# Patient Record
Sex: Female | Born: 1965
Health system: Southern US, Community
[De-identification: ages and names within clinical notes are randomized; demographics above are authoritative.]

## PROBLEM LIST (undated history)

## (undated) DIAGNOSIS — J302 Other seasonal allergic rhinitis: Secondary | ICD-10-CM

## (undated) DIAGNOSIS — F419 Anxiety disorder, unspecified: Secondary | ICD-10-CM

## (undated) DIAGNOSIS — Z87442 Personal history of urinary calculi: Secondary | ICD-10-CM

## (undated) DIAGNOSIS — F32A Depression, unspecified: Secondary | ICD-10-CM

## (undated) DIAGNOSIS — F329 Major depressive disorder, single episode, unspecified: Secondary | ICD-10-CM

## (undated) DIAGNOSIS — Z973 Presence of spectacles and contact lenses: Secondary | ICD-10-CM

## (undated) DIAGNOSIS — J3089 Other allergic rhinitis: Secondary | ICD-10-CM

## (undated) DIAGNOSIS — Z9889 Other specified postprocedural states: Secondary | ICD-10-CM

## (undated) DIAGNOSIS — R112 Nausea with vomiting, unspecified: Secondary | ICD-10-CM

## (undated) DIAGNOSIS — K6289 Other specified diseases of anus and rectum: Secondary | ICD-10-CM

## (undated) DIAGNOSIS — M1990 Unspecified osteoarthritis, unspecified site: Secondary | ICD-10-CM

## (undated) DIAGNOSIS — K601 Chronic anal fissure: Secondary | ICD-10-CM

## (undated) HISTORY — PX: LAMINECTOMY AND MICRODISCECTOMY LUMBAR SPINE: SHX1913

## (undated) HISTORY — PX: APPENDECTOMY: SHX54

---

## 1991-12-09 HISTORY — PX: VAGINAL HYSTERECTOMY: SUR661

## 1998-11-26 ENCOUNTER — Encounter: Payer: Self-pay | Admitting: Family Medicine

## 1998-11-26 ENCOUNTER — Ambulatory Visit (HOSPITAL_COMMUNITY): Admission: RE | Admit: 1998-11-26 | Discharge: 1998-11-26 | Payer: Self-pay | Admitting: Family Medicine

## 2001-03-04 ENCOUNTER — Ambulatory Visit (HOSPITAL_COMMUNITY): Admission: RE | Admit: 2001-03-04 | Discharge: 2001-03-04 | Payer: Self-pay | Admitting: Family Medicine

## 2001-03-04 ENCOUNTER — Encounter: Payer: Self-pay | Admitting: Family Medicine

## 2004-07-03 ENCOUNTER — Other Ambulatory Visit: Admission: RE | Admit: 2004-07-03 | Discharge: 2004-07-03 | Payer: Self-pay | Admitting: Family Medicine

## 2005-10-09 ENCOUNTER — Ambulatory Visit (HOSPITAL_COMMUNITY): Admission: RE | Admit: 2005-10-09 | Discharge: 2005-10-09 | Payer: Self-pay | Admitting: Neurological Surgery

## 2006-09-16 ENCOUNTER — Other Ambulatory Visit: Admission: RE | Admit: 2006-09-16 | Discharge: 2006-09-16 | Payer: Self-pay | Admitting: Family Medicine

## 2007-10-21 ENCOUNTER — Other Ambulatory Visit: Admission: RE | Admit: 2007-10-21 | Discharge: 2007-10-21 | Payer: Self-pay | Admitting: Family Medicine

## 2011-04-25 NOTE — Op Note (Signed)
NAMETIWANA, CHAVIS               ACCOUNT NO.:  [id number]   MEDICAL RECORD NO.:  [id number]          PATIENT TYPE:  AMB   LOCATION:  SDS                          FACILITY:  [facility]   PHYSICIAN:  Tia Alert, MD     DATE OF BIRTH:  11-30-1966   DATE OF PROCEDURE:  10/09/2005  DATE OF DISCHARGE:                                 OPERATIVE REPORT   PREOPERATIVE DIAGNOSIS:  Lumbar disc herniation L5-S1 on the right with  right S1 radiculopathy.   POSTOPERATIVE DIAGNOSIS:  Lumbar disc herniation L5-S1 on the right with right S1 radiculopathy.   PROCEDURE:  Right hemilaminectomy, medial facetectomy, and foraminotomy L5-  S1 on the right followed by microdiscectomy L5-S1 on the right utilizing  microscopic dissection.   SURGEON:  Tia Alert, MD   ASSISTANT:  Reinaldo Meeker, M.D.   ANESTHESIA:  General endotracheal anesthesia.   COMPLICATIONS:  None apparent.   INDICATIONS FOR PROCEDURE:  Ms. Munoz is a 45 year old white female who was  referred with right leg pain.  She had an MRI which showed a very large disc  herniation at L5-S1 on the right side with severe compression of the right  S1 nerve root.  She had lost her right Achilles reflex and, also, had  weakness in her right calf.  I recommended a lumbar discectomy at L5-S1 on  the right.  She understood the risks, benefits, and expected outcomes, and  wished to proceed.   DESCRIPTION OF PROCEDURE:  The patient was taken to the operating room and  after induction of adequate general endotracheal anesthesia, she was rolled  in a prone position on the Wilson frame and all pressure points padded.  Her  lumbar region was prepped with DuraPrep and then draped in the usual sterile  fashion.  3 mL of local anesthesia was injected and a small dorsal midline  incision was made and carried down to the lumbosacral  fascia.  The fascia  was opened on the patient's right side and taken down in a subperiosteal  fashion to expose the  L5-S1 interspace on the right side.  Interoperative x-  ray confirmed my level and then hemilaminectomy, medial facetectomy, and  foraminotomy was performed at L5-S1 on the right side utilizing the 3 mm  Kerrison punch.  The yellow ligament was opened and removed with the  Kerrison punch.  The underlying dura and S1 nerve root was identified and  decompressed.  I then retracted this medially and coagulated the epidural  venous vasculature and found a large free fragment.  I removed this with a  combination of a nerve hook and pituitary rongeur.  I then performed a  thorough intradiskal discectomy and then palpated with a nerve hook and a  coronary dilator to assure adequate decompression and assured there were no  more free fragments underneath the nerve root.  I dissected into the  foramen.  The nerve root was free.  There was no evidence of residual disc.  I then copiously irrigated with Bacitracin containing saline solution, dried  all bleeding points with bipolar cautery, lined  the dura with Gelfoam, and  then closed the fascia with interrupted #1 Vicryl, closed the subcutaneous  and subcuticular tissues with 2-0 and  3-0 Vicryl, and closed the skin with Dermabond.  The drapes were removed and  sterile dressing was applied.  The patient was awakened from general  anesthesia and transported to the recovery room in stable condition.  At the  end of the procedure, all sponge, needle, and instrument counts were  correct.      Tia Alert, MD  Electronically Signed     DSJ/MEDQ  D:  10/09/2005  T:  10/09/2005  Job:  [id number]

## 2014-05-22 ENCOUNTER — Other Ambulatory Visit: Payer: Self-pay

## 2014-05-22 DIAGNOSIS — Z1231 Encounter for screening mammogram for malignant neoplasm of breast: Secondary | ICD-10-CM

## 2014-05-29 ENCOUNTER — Encounter (INDEPENDENT_AMBULATORY_CARE_PROVIDER_SITE_OTHER): Payer: Self-pay

## 2014-05-29 ENCOUNTER — Ambulatory Visit
Admission: RE | Admit: 2014-05-29 | Discharge: 2014-05-29 | Disposition: A | Discharge status: Home / Self Care | Payer: Self-pay | Source: Ambulatory Visit

## 2014-05-29 DIAGNOSIS — Z1231 Encounter for screening mammogram for malignant neoplasm of breast: Secondary | ICD-10-CM

## 2015-06-26 ENCOUNTER — Other Ambulatory Visit: Payer: Self-pay | Admitting: Obstetrics and Gynecology

## 2015-06-28 ENCOUNTER — Other Ambulatory Visit: Payer: Self-pay | Admitting: Obstetrics and Gynecology

## 2015-06-28 DIAGNOSIS — M810 Age-related osteoporosis without current pathological fracture: Secondary | ICD-10-CM

## 2015-06-28 DIAGNOSIS — Z1231 Encounter for screening mammogram for malignant neoplasm of breast: Secondary | ICD-10-CM

## 2015-07-06 ENCOUNTER — Ambulatory Visit: Payer: Self-pay

## 2015-07-06 ENCOUNTER — Other Ambulatory Visit: Payer: Self-pay

## 2015-07-26 ENCOUNTER — Ambulatory Visit
Admission: RE | Admit: 2015-07-26 | Discharge: 2015-07-26 | Disposition: A | Discharge status: Home / Self Care | Payer: BLUE CROSS/BLUE SHIELD | Source: Ambulatory Visit | Attending: Obstetrics and Gynecology | Admitting: Obstetrics and Gynecology

## 2015-07-26 DIAGNOSIS — M810 Age-related osteoporosis without current pathological fracture: Secondary | ICD-10-CM

## 2015-07-26 DIAGNOSIS — Z1231 Encounter for screening mammogram for malignant neoplasm of breast: Secondary | ICD-10-CM

## 2015-09-14 ENCOUNTER — Ambulatory Visit: Payer: Self-pay

## 2015-09-14 ENCOUNTER — Other Ambulatory Visit: Payer: Self-pay

## 2016-10-25 DIAGNOSIS — Z23 Encounter for immunization: Secondary | ICD-10-CM | POA: Diagnosis not present

## 2016-12-02 DIAGNOSIS — J329 Chronic sinusitis, unspecified: Secondary | ICD-10-CM | POA: Diagnosis not present

## 2017-02-04 ENCOUNTER — Other Ambulatory Visit: Payer: Self-pay | Admitting: Physician Assistant

## 2017-02-04 ENCOUNTER — Other Ambulatory Visit (HOSPITAL_COMMUNITY)
Admission: RE | Admit: 2017-02-04 | Discharge: 2017-02-04 | Disposition: A | Discharge status: Home / Self Care | Payer: BLUE CROSS/BLUE SHIELD | Source: Ambulatory Visit | Attending: Physician Assistant | Admitting: Physician Assistant

## 2017-02-04 DIAGNOSIS — Z1151 Encounter for screening for human papillomavirus (HPV): Secondary | ICD-10-CM | POA: Diagnosis not present

## 2017-02-04 DIAGNOSIS — K580 Irritable bowel syndrome with diarrhea: Secondary | ICD-10-CM | POA: Diagnosis not present

## 2017-02-04 DIAGNOSIS — G4700 Insomnia, unspecified: Secondary | ICD-10-CM | POA: Diagnosis not present

## 2017-02-04 DIAGNOSIS — F419 Anxiety disorder, unspecified: Secondary | ICD-10-CM | POA: Diagnosis not present

## 2017-02-04 DIAGNOSIS — Z0000 Encounter for general adult medical examination without abnormal findings: Secondary | ICD-10-CM | POA: Insufficient documentation

## 2017-02-04 DIAGNOSIS — J309 Allergic rhinitis, unspecified: Secondary | ICD-10-CM | POA: Diagnosis not present

## 2017-02-05 LAB — CYTOLOGY - PAP
Diagnosis: NEGATIVE
HPV: NOT DETECTED

## 2017-04-02 DIAGNOSIS — K580 Irritable bowel syndrome with diarrhea: Secondary | ICD-10-CM | POA: Diagnosis not present

## 2017-04-02 DIAGNOSIS — Z1211 Encounter for screening for malignant neoplasm of colon: Secondary | ICD-10-CM | POA: Diagnosis not present

## 2017-05-16 DIAGNOSIS — S40862A Insect bite (nonvenomous) of left upper arm, initial encounter: Secondary | ICD-10-CM | POA: Diagnosis not present

## 2017-09-08 DIAGNOSIS — M62830 Muscle spasm of back: Secondary | ICD-10-CM | POA: Diagnosis not present

## 2017-09-08 DIAGNOSIS — F419 Anxiety disorder, unspecified: Secondary | ICD-10-CM | POA: Diagnosis not present

## 2017-09-08 DIAGNOSIS — Z23 Encounter for immunization: Secondary | ICD-10-CM | POA: Diagnosis not present

## 2018-02-11 DIAGNOSIS — K580 Irritable bowel syndrome with diarrhea: Secondary | ICD-10-CM | POA: Diagnosis not present

## 2018-02-11 DIAGNOSIS — G4700 Insomnia, unspecified: Secondary | ICD-10-CM | POA: Diagnosis not present

## 2018-02-11 DIAGNOSIS — J309 Allergic rhinitis, unspecified: Secondary | ICD-10-CM | POA: Diagnosis not present

## 2018-02-11 DIAGNOSIS — M545 Low back pain: Secondary | ICD-10-CM | POA: Diagnosis not present

## 2018-03-18 DIAGNOSIS — K6289 Other specified diseases of anus and rectum: Secondary | ICD-10-CM | POA: Diagnosis not present

## 2018-03-18 DIAGNOSIS — R194 Change in bowel habit: Secondary | ICD-10-CM | POA: Diagnosis not present

## 2018-03-18 DIAGNOSIS — K600 Acute anal fissure: Secondary | ICD-10-CM | POA: Diagnosis not present

## 2018-03-22 DIAGNOSIS — K580 Irritable bowel syndrome with diarrhea: Secondary | ICD-10-CM | POA: Diagnosis not present

## 2018-03-22 DIAGNOSIS — K600 Acute anal fissure: Secondary | ICD-10-CM | POA: Diagnosis not present

## 2018-03-22 DIAGNOSIS — K6289 Other specified diseases of anus and rectum: Secondary | ICD-10-CM | POA: Diagnosis not present

## 2018-03-22 DIAGNOSIS — Z1211 Encounter for screening for malignant neoplasm of colon: Secondary | ICD-10-CM | POA: Diagnosis not present

## 2018-03-31 ENCOUNTER — Emergency Department (HOSPITAL_COMMUNITY)
Admission: EM | Admit: 2018-03-31 | Discharge: 2018-03-31 | Disposition: A | Discharge status: Home / Self Care | Payer: BLUE CROSS/BLUE SHIELD | Attending: Emergency Medicine | Admitting: Emergency Medicine

## 2018-03-31 ENCOUNTER — Encounter (HOSPITAL_COMMUNITY): Payer: Self-pay

## 2018-03-31 ENCOUNTER — Other Ambulatory Visit: Payer: Self-pay

## 2018-03-31 DIAGNOSIS — K648 Other hemorrhoids: Secondary | ICD-10-CM | POA: Diagnosis not present

## 2018-03-31 DIAGNOSIS — K644 Residual hemorrhoidal skin tags: Secondary | ICD-10-CM

## 2018-03-31 DIAGNOSIS — K6289 Other specified diseases of anus and rectum: Secondary | ICD-10-CM | POA: Diagnosis not present

## 2018-03-31 MED ORDER — OXYCODONE HCL 5 MG PO TABS: 5.0000 mg | ORAL_TABLET | Freq: Every day | ORAL | 0 refills | Status: DC

## 2018-03-31 MED ORDER — HYDROMORPHONE HCL 2 MG/ML IJ SOLN
1.0000 mg | Freq: Once | INTRAMUSCULAR | Status: AC
  Administered 2018-03-31: 1 mg via INTRAMUSCULAR
  Filled 2018-03-31: qty 1

## 2018-03-31 NOTE — Discharge Instructions (Signed)
You need further evaluation by general surgery.  If your symptoms are not improving with conservative management, you may need surgical intervention.   It is important to stay well hydrated, drink 1-2 L of water daily. Miralax at least once daily and stool softener to help stool remain soft and prevent further trauma.  Continue with lidocaine and diltiazem topical creams.    We discussed risk of consitipation with use of opioid pain medications.  To help give you some relief, I have given you a short prescription for oxycodone 5 mg. Take this only once daily with breakfast.  During the day take 1000 mg acetaminophen (tylenol) PLUS 600 mg ibuprofen (advil, aleve) every 6-8 hours.   Return for fevers, worsening pain, swelling to the area, abdominal pain, drainage of pus.

## 2018-03-31 NOTE — ED Triage Notes (Signed)
Pt presents for evaluation of ongoing rectal pain since early march. Pt reports initially thought it was hemorrhoids but then told it was an anal fissure. Has had multiple creams and pain meds with no relief. Is unable to tolerate sitting.

## 2018-03-31 NOTE — ED Notes (Signed)
Pt stable, ambulatory, states understanding of discharge instructions 

## 2018-03-31 NOTE — ED Notes (Signed)
Pt ambulated to bathroom 

## 2018-03-31 NOTE — ED Provider Notes (Signed)
[facility] EMERGENCY DEPARTMENT Provider Note   CSN: [id number] Arrival date & time: 03/31/18  4098     History   Chief Complaint Chief Complaint  Patient presents with  . Rectal Pain    HPI Cheyenne Ramirez is a 52 y.o. female with no past medical history is here for evaluation of rectal pain for 2 months.  Pain is severe, intermittent. Has been seen by primary care doctor, 2 gastroenterologists, urgent care since and has been told she has hemorrhoids and/or fissures.  PCP has referred her to [facility] surgery for further evaluation, awaiting appointment.  Has tried topical lidocaine, nitroglycerin, diltiazem, Aleve without relief.  States the pain is worse in the mornings after having a bowel movement.  Throughout the day pain is more tolerable however at nighttime pain returns.  She tries to sit, stand, walk, laying down while she is in pain and these did not help.  Associated symptoms include bright red blood on toilet paper and on stool.  She denies fevers, chills, abdominal pain.  Frequency and consistency of bowel movements unchanged over the last several months.  Does not take any pain medications.  HPI  History reviewed. No pertinent past medical history.  There are no active problems to display for this patient.   History reviewed. No pertinent surgical history.   OB History   None      Home Medications    Prior to Admission medications   Medication Sig Start Date End Date Taking? Authorizing Provider  oxyCODONE (OXY IR/ROXICODONE) 5 MG immediate release tablet Take 1 tablet (5 mg total) by mouth daily after breakfast for 5 doses. 03/31/18 04/05/18  Liberty Handy, PA-C    Family History No family history on file.  Social History Social History   Tobacco Use  . Smoking status: Not on file  Substance Use Topics  . Alcohol use: Not on file  . Drug use: Not on file     Allergies   Sulfa antibiotics   Review of  Systems Review of Systems  Genitourinary:       Rectal pain  All other systems reviewed and are negative.    Physical Exam Updated Vital Signs BP (!) 94/49   Pulse 66   Temp 98.6 F (37 C) (Oral)   Resp 14   SpO2 96%   Physical Exam  Constitutional: She is oriented to person, place, and time. She appears well-developed and well-nourished. No distress.  Non toxic  HENT:  Head: Normocephalic and atraumatic.  Nose: Nose normal.  Mouth/Throat: No oropharyngeal exudate.  Moist mucous membranes   Eyes: Pupils are equal, round, and reactive to light. Conjunctivae and EOM are normal.  Neck: Normal range of motion.  Cardiovascular: Normal rate, regular rhythm and intact distal pulses.  No murmur heard. 2+ DP and radial pulses bilaterally. No LE edema.   Pulmonary/Chest: Effort normal and breath sounds normal. No respiratory distress. She has no wheezes. She has no rales.  Abdominal: Soft. Bowel sounds are normal. There is no tenderness.  No G/R/R. No suprapubic or CVA tenderness.   Genitourinary:  Genitourinary Comments: 2 large very tender hemorrhoids posteriorly, non thrombosed, pink, without bleeding, erythema, drainage.  1 non tender hemorrhoid anteriorly, non thrombosed.  1 non tender internal hemorrhoid posteriorly, non tender.  RN present during exam. No external fissures noted.  No induration or swelling of the perianal skin.   Stool color is brown with no gross blood noted.   Able to  palpate 3-4 cm of rectal vault without signs of abscess.   DRE reveals good sphincter tone.    Musculoskeletal: Normal range of motion. She exhibits no deformity.  Neurological: She is alert and oriented to person, place, and time.  Skin: Skin is warm and dry. Capillary refill takes less than 2 seconds.  Psychiatric: She has a normal mood and affect. Her behavior is normal. Judgment and thought content normal.  Nursing note and vitals reviewed.    ED Treatments / Results  Labs (all  labs ordered are listed, but only abnormal results are displayed) Labs Reviewed - No data to display  EKG None  Radiology No results found.  Procedures Procedures (including critical care time)  Medications Ordered in ED Medications  HYDROmorphone (DILAUDID) injection 1 mg (1 mg Intramuscular Given 03/31/18 1433)     Initial Impression / Assessment and Plan / ED Course  I have reviewed the triage vital signs and the nursing notes.  Pertinent labs & imaging results that were available during my care of the patient were reviewed by me and considered in my medical decision making (see chart for details).     52 year old with rectal pain and hematochezia for 2 months.  Pain refractory to topical agents, NSAIDs.  She has had no fevers, chills, abdominal pain, changes in frequency and stool consistency.  Has been seen by multiple providers for this over the last 2 months and has referral for general surgery.  Exam as above shows nonthrombosed, exquisitely tender external hemorrhoids.  Afebrile.  Abdomen nontender nondistended.  Do not think emergent lab work or imaging is indicated at this time, low suspicion for deep pelvic/rectal abscess.  Will discharge with MiraLAX, stool softener, sitz baths, high-dose NSAIDs.  I discussed risk of opioid pain medication including constipation and worsening pain.  However patient has a 45-minute commute to and from work, states she cannot miss work days and is desperate for some pain relief.  Will give short rx for percocet to be used once a day in the AM, pt aware she needs to increase water intake and be compliant with miralax and stool softener. Discussed return precautions. She is to call general surgery today and schedule appointment. Final Clinical Impressions(s) / ED Diagnoses   Final diagnoses:  Rectal pain  External hemorrhoids    ED Discharge Orders        Ordered    oxyCODONE (OXY IR/ROXICODONE) 5 MG immediate release tablet  Daily after  breakfast     03/31/18 1436       Jerrell MylarGibbons, Claudia J, PA-C 03/31/18 1439    Benjiman CorePickering, Nathan, MD 03/31/18 2140

## 2018-04-05 ENCOUNTER — Other Ambulatory Visit: Payer: Self-pay

## 2018-04-05 ENCOUNTER — Encounter (HOSPITAL_BASED_OUTPATIENT_CLINIC_OR_DEPARTMENT_OTHER): Payer: Self-pay | Admitting: *Deleted

## 2018-04-05 DIAGNOSIS — K602 Anal fissure, unspecified: Secondary | ICD-10-CM | POA: Diagnosis not present

## 2018-04-05 NOTE — Progress Notes (Signed)
SPOKE W/ PT VIA PHONE FOR PRE-OP INTERVIEW.  NPO AFTER MN WITH EXCEPTION CLEAR LIQUIDS UNTIL 0815 (NO CREAM / MILK PRODUCTS).  ARRIVE AT 1215.  WILL TAKE CELEXA, XANAX, AND DO NASAL SPRAY AM DOS W/ SIPS OF WATER.

## 2018-04-06 ENCOUNTER — Ambulatory Visit: Payer: Self-pay | Admitting: General Surgery

## 2018-04-08 ENCOUNTER — Ambulatory Visit (HOSPITAL_BASED_OUTPATIENT_CLINIC_OR_DEPARTMENT_OTHER)
Admission: RE | Admit: 2018-04-08 | Discharge: 2018-04-08 | Disposition: A | Discharge status: Home / Self Care | Payer: BLUE CROSS/BLUE SHIELD | Source: Ambulatory Visit | Attending: General Surgery | Admitting: General Surgery

## 2018-04-08 ENCOUNTER — Encounter (HOSPITAL_BASED_OUTPATIENT_CLINIC_OR_DEPARTMENT_OTHER): Payer: Self-pay | Admitting: Anesthesiology

## 2018-04-08 ENCOUNTER — Encounter (HOSPITAL_BASED_OUTPATIENT_CLINIC_OR_DEPARTMENT_OTHER)
Admission: RE | Disposition: A | Discharge status: Home / Self Care | Payer: Self-pay | Source: Ambulatory Visit | Attending: General Surgery

## 2018-04-08 ENCOUNTER — Ambulatory Visit (HOSPITAL_BASED_OUTPATIENT_CLINIC_OR_DEPARTMENT_OTHER): Payer: BLUE CROSS/BLUE SHIELD | Admitting: Anesthesiology

## 2018-04-08 DIAGNOSIS — Z881 Allergy status to other antibiotic agents status: Secondary | ICD-10-CM | POA: Insufficient documentation

## 2018-04-08 DIAGNOSIS — K641 Second degree hemorrhoids: Secondary | ICD-10-CM | POA: Insufficient documentation

## 2018-04-08 DIAGNOSIS — K601 Chronic anal fissure: Secondary | ICD-10-CM | POA: Diagnosis not present

## 2018-04-08 DIAGNOSIS — Z882 Allergy status to sulfonamides status: Secondary | ICD-10-CM | POA: Insufficient documentation

## 2018-04-08 DIAGNOSIS — Z79899 Other long term (current) drug therapy: Secondary | ICD-10-CM | POA: Diagnosis not present

## 2018-04-08 DIAGNOSIS — M1990 Unspecified osteoarthritis, unspecified site: Secondary | ICD-10-CM | POA: Diagnosis not present

## 2018-04-08 DIAGNOSIS — K640 First degree hemorrhoids: Secondary | ICD-10-CM | POA: Diagnosis not present

## 2018-04-08 DIAGNOSIS — F329 Major depressive disorder, single episode, unspecified: Secondary | ICD-10-CM | POA: Diagnosis not present

## 2018-04-08 DIAGNOSIS — F419 Anxiety disorder, unspecified: Secondary | ICD-10-CM | POA: Insufficient documentation

## 2018-04-08 HISTORY — DX: Presence of spectacles and contact lenses: Z97.3

## 2018-04-08 HISTORY — DX: Anxiety disorder, unspecified: F41.9

## 2018-04-08 HISTORY — DX: Unspecified osteoarthritis, unspecified site: M19.90

## 2018-04-08 HISTORY — DX: Nausea with vomiting, unspecified: R11.2

## 2018-04-08 HISTORY — DX: Major depressive disorder, single episode, unspecified: F32.9

## 2018-04-08 HISTORY — PX: SPHINCTEROTOMY: SHX5279

## 2018-04-08 HISTORY — DX: Other specified diseases of anus and rectum: K62.89

## 2018-04-08 HISTORY — DX: Personal history of urinary calculi: Z87.442

## 2018-04-08 HISTORY — DX: Other allergic rhinitis: J30.89

## 2018-04-08 HISTORY — DX: Other seasonal allergic rhinitis: J30.2

## 2018-04-08 HISTORY — DX: Depression, unspecified: F32.A

## 2018-04-08 HISTORY — DX: Other specified postprocedural states: Z98.890

## 2018-04-08 HISTORY — DX: Chronic anal fissure: K60.1

## 2018-04-08 SURGERY — EXAM UNDER ANESTHESIA
Anesthesia: Monitor Anesthesia Care

## 2018-04-08 MED ORDER — SODIUM CHLORIDE 0.9 % IV SOLN
250.0000 mL | INTRAVENOUS | Status: DC | PRN
  Filled 2018-04-08: qty 250

## 2018-04-08 MED ORDER — LACTATED RINGERS IV SOLN
INTRAVENOUS | Status: DC
  Filled 2018-04-08: qty 1000

## 2018-04-08 MED ORDER — ONDANSETRON HCL 4 MG/2ML IJ SOLN
INTRAMUSCULAR | Status: DC | PRN
  Administered 2018-04-08: 4 mg via INTRAVENOUS

## 2018-04-08 MED ORDER — BUPIVACAINE LIPOSOME 1.3 % IJ SUSP
INTRAMUSCULAR | Status: DC | PRN
  Administered 2018-04-08: 20 mL

## 2018-04-08 MED ORDER — ONDANSETRON HCL 4 MG/2ML IJ SOLN
INTRAMUSCULAR | Status: AC
  Filled 2018-04-08: qty 2

## 2018-04-08 MED ORDER — ACETAMINOPHEN 325 MG PO TABS
650.0000 mg | ORAL_TABLET | ORAL | Status: DC | PRN
Start: 2018-04-08 — End: 2018-04-08
  Filled 2018-04-08: qty 2

## 2018-04-08 MED ORDER — PROPOFOL 500 MG/50ML IV EMUL
INTRAVENOUS | Status: DC | PRN
  Administered 2018-04-08: 200 ug/kg/min via INTRAVENOUS

## 2018-04-08 MED ORDER — FENTANYL CITRATE (PF) 100 MCG/2ML IJ SOLN
25.0000 ug | INTRAMUSCULAR | Status: DC | PRN
  Filled 2018-04-08: qty 1

## 2018-04-08 MED ORDER — ACETAMINOPHEN 500 MG PO TABS
ORAL_TABLET | ORAL | Status: AC
  Filled 2018-04-08: qty 2

## 2018-04-08 MED ORDER — PROPOFOL 10 MG/ML IV BOLUS
INTRAVENOUS | Status: AC
Start: 2018-04-08 — End: 2018-04-08
  Filled 2018-04-08: qty 20

## 2018-04-08 MED ORDER — ONABOTULINUMTOXINA 100 UNITS IJ SOLR
INTRAMUSCULAR | Status: DC | PRN
  Administered 2018-04-08: 100 [IU] via INTRAMUSCULAR

## 2018-04-08 MED ORDER — LIDOCAINE 5 % EX OINT
TOPICAL_OINTMENT | CUTANEOUS | Status: DC | PRN
  Administered 2018-04-08: 1

## 2018-04-08 MED ORDER — SODIUM CHLORIDE 0.9% FLUSH
3.0000 mL | Freq: Two times a day (BID) | INTRAVENOUS | Status: DC
  Filled 2018-04-08: qty 3

## 2018-04-08 MED ORDER — OXYCODONE HCL 5 MG PO TABS
ORAL_TABLET | ORAL | Status: AC
Start: 2018-04-08 — End: 2018-04-08
  Filled 2018-04-08: qty 1

## 2018-04-08 MED ORDER — MEPERIDINE HCL 25 MG/ML IJ SOLN
6.2500 mg | INTRAMUSCULAR | Status: DC | PRN
  Filled 2018-04-08: qty 1

## 2018-04-08 MED ORDER — FENTANYL CITRATE (PF) 100 MCG/2ML IJ SOLN
INTRAMUSCULAR | Status: AC
  Filled 2018-04-08: qty 2

## 2018-04-08 MED ORDER — FENTANYL CITRATE (PF) 100 MCG/2ML IJ SOLN
INTRAMUSCULAR | Status: DC | PRN
  Administered 2018-04-08 (×2): 50 ug via INTRAVENOUS

## 2018-04-08 MED ORDER — LACTATED RINGERS IV SOLN
INTRAVENOUS | Status: DC
  Administered 2018-04-08 (×2): via INTRAVENOUS
  Filled 2018-04-08: qty 1000

## 2018-04-08 MED ORDER — SODIUM CHLORIDE 0.9% FLUSH
3.0000 mL | INTRAVENOUS | Status: DC | PRN
  Filled 2018-04-08: qty 3

## 2018-04-08 MED ORDER — MIDAZOLAM HCL 2 MG/2ML IJ SOLN
INTRAMUSCULAR | Status: DC | PRN
  Administered 2018-04-08 (×2): 1 mg via INTRAVENOUS

## 2018-04-08 MED ORDER — DEXAMETHASONE SODIUM PHOSPHATE 10 MG/ML IJ SOLN
INTRAMUSCULAR | Status: AC
  Filled 2018-04-08: qty 1

## 2018-04-08 MED ORDER — KETAMINE HCL 10 MG/ML IJ SOLN
INTRAMUSCULAR | Status: DC | PRN
  Administered 2018-04-08: 20 mg via INTRAVENOUS
  Administered 2018-04-08 (×2): 5 mg via INTRAVENOUS

## 2018-04-08 MED ORDER — OXYCODONE HCL 5 MG PO TABS
5.0000 mg | ORAL_TABLET | ORAL | Status: DC | PRN
  Administered 2018-04-08: 5 mg via ORAL
  Filled 2018-04-08: qty 2

## 2018-04-08 MED ORDER — BUPIVACAINE-EPINEPHRINE 0.5% -1:200000 IJ SOLN
INTRAMUSCULAR | Status: DC | PRN
  Administered 2018-04-08: 30 mL

## 2018-04-08 MED ORDER — TRAMADOL HCL 50 MG PO TABS: 50.0000 mg | ORAL_TABLET | Freq: Three times a day (TID) | ORAL | 0 refills | Status: DC | PRN

## 2018-04-08 MED ORDER — ACETAMINOPHEN 650 MG RE SUPP
650.0000 mg | RECTAL | Status: DC | PRN
  Filled 2018-04-08: qty 1

## 2018-04-08 MED ORDER — ACETAMINOPHEN 500 MG PO TABS
1000.0000 mg | ORAL_TABLET | ORAL | Status: AC
  Administered 2018-04-08: 1000 mg via ORAL
  Filled 2018-04-08: qty 2

## 2018-04-08 MED ORDER — METOCLOPRAMIDE HCL 5 MG/ML IJ SOLN
10.0000 mg | Freq: Once | INTRAMUSCULAR | Status: DC | PRN
  Filled 2018-04-08: qty 2

## 2018-04-08 MED ORDER — SODIUM CHLORIDE 0.9 % IJ SOLN
INTRAMUSCULAR | Status: DC | PRN
  Administered 2018-04-08: 50 mL

## 2018-04-08 MED ORDER — DEXAMETHASONE SODIUM PHOSPHATE 10 MG/ML IJ SOLN
INTRAMUSCULAR | Status: DC | PRN
  Administered 2018-04-08: 10 mg via INTRAVENOUS

## 2018-04-08 SURGICAL SUPPLY — 55 items
APL SKNCLS STERI-STRIP NONHPOA (GAUZE/BANDAGES/DRESSINGS) ×2
BENZOIN TINCTURE PRP APPL 2/3 (GAUZE/BANDAGES/DRESSINGS) ×4 IMPLANT
BLADE EXTENDED COATED 6.5IN (ELECTRODE) IMPLANT
BLADE HEX COATED 2.75 (ELECTRODE) ×2 IMPLANT
BLADE SURG 10 STRL SS (BLADE) IMPLANT
BLADE SURG 15 STRL LF DISP TIS (BLADE) ×1 IMPLANT
BLADE SURG 15 STRL SS (BLADE) ×2
BRIEF STRETCH FOR OB PAD LRG (UNDERPADS AND DIAPERS) ×4 IMPLANT
CANISTER SUCT 3000ML PPV (MISCELLANEOUS) ×2 IMPLANT
COVER BACK TABLE 60X90IN (DRAPES) ×2 IMPLANT
COVER MAYO STAND STRL (DRAPES) ×2 IMPLANT
DECANTER SPIKE VIAL GLASS SM (MISCELLANEOUS) ×2 IMPLANT
DRAPE LAPAROTOMY 100X72 PEDS (DRAPES) ×2 IMPLANT
DRAPE UTILITY XL STRL (DRAPES) ×2 IMPLANT
DRSG PAD ABDOMINAL 8X10 ST (GAUZE/BANDAGES/DRESSINGS) ×2 IMPLANT
ELECT REM PT RETURN 9FT ADLT (ELECTROSURGICAL) ×2
ELECTRODE REM PT RTRN 9FT ADLT (ELECTROSURGICAL) ×1 IMPLANT
GAUZE SPONGE 4X4 12PLY STRL (GAUZE/BANDAGES/DRESSINGS) ×2 IMPLANT
GAUZE SPONGE 4X4 16PLY XRAY LF (GAUZE/BANDAGES/DRESSINGS) IMPLANT
GLOVE BIO SURGEON STRL SZ 6.5 (GLOVE) ×2 IMPLANT
GLOVE BIOGEL PI IND STRL 7.0 (GLOVE) ×1 IMPLANT
GLOVE BIOGEL PI INDICATOR 7.0 (GLOVE) ×1
GLOVE INDICATOR 7.0 STRL GRN (GLOVE) ×2 IMPLANT
GOWN SPEC L3 XXLG W/TWL (GOWN DISPOSABLE) ×2 IMPLANT
GOWN STRL REUS W/TWL 2XL LVL3 (GOWN DISPOSABLE) ×2 IMPLANT
HYDROGEN PEROXIDE 16OZ (MISCELLANEOUS) ×2 IMPLANT
KIT TURNOVER CYSTO (KITS) ×2 IMPLANT
LOOP VESSEL MAXI BLUE (MISCELLANEOUS) IMPLANT
NDL SAFETY ECLIPSE 18X1.5 (NEEDLE) IMPLANT
NEEDLE HYPO 18GX1.5 SHARP (NEEDLE)
NEEDLE HYPO 22GX1.5 SAFETY (NEEDLE) ×2 IMPLANT
NS IRRIG 500ML POUR BTL (IV SOLUTION) ×2 IMPLANT
PACK BASIN DAY SURGERY FS (CUSTOM PROCEDURE TRAY) ×2 IMPLANT
PAD ABD 8X10 STRL (GAUZE/BANDAGES/DRESSINGS) ×2 IMPLANT
PAD ARMBOARD 7.5X6 YLW CONV (MISCELLANEOUS) IMPLANT
PENCIL BUTTON HOLSTER BLD 10FT (ELECTRODE) ×2 IMPLANT
SPONGE HEMORRHOID 8X3CM (HEMOSTASIS) IMPLANT
SPONGE SURGIFOAM ABS GEL 12-7 (HEMOSTASIS) IMPLANT
SUCTION FRAZIER HANDLE 10FR (MISCELLANEOUS)
SUCTION TUBE FRAZIER 10FR DISP (MISCELLANEOUS) IMPLANT
SUT CHROMIC 2 0 SH (SUTURE) IMPLANT
SUT CHROMIC 3 0 SH 27 (SUTURE) IMPLANT
SUT ETHIBOND 0 (SUTURE) IMPLANT
SUT VIC AB 2-0 SH 27 (SUTURE)
SUT VIC AB 2-0 SH 27XBRD (SUTURE) IMPLANT
SUT VIC AB 3-0 SH 18 (SUTURE) IMPLANT
SUT VIC AB 4-0 P-3 18XBRD (SUTURE) IMPLANT
SUT VIC AB 4-0 P3 18 (SUTURE)
SYR CONTROL 10ML LL (SYRINGE) ×2 IMPLANT
TOWEL OR 17X24 6PK STRL BLUE (TOWEL DISPOSABLE) ×2 IMPLANT
TRAY DSU PREP LF (CUSTOM PROCEDURE TRAY) ×2 IMPLANT
TUBE CONNECTING 12X1/4 (SUCTIONS) ×2 IMPLANT
UNDERPAD 30X30 (UNDERPADS AND DIAPERS) ×2 IMPLANT
WATER STERILE IRR 500ML POUR (IV SOLUTION) ×2 IMPLANT
YANKAUER SUCT BULB TIP NO VENT (SUCTIONS) ×2 IMPLANT

## 2018-04-08 NOTE — Discharge Instructions (Addendum)

## 2018-04-08 NOTE — Transfer of Care (Signed)
Immediate Anesthesia Transfer of Care Note  Patient: Cheyenne Ramirez  Procedure(s) Performed: ANAL EXAM UNDER ANESTHESIA (N/A ) CHEMICAL SPHINCTEROTOMY BOTOX (N/A )  Patient Location: PACU  Anesthesia Type:MAC  Level of Consciousness: awake, alert , oriented and patient cooperative  Airway & Oxygen Therapy: Patient Spontanous Breathing and Patient connected to nasal cannula oxygen  Post-op Assessment: Report given to RN and Post -op Vital signs reviewed and stable  Post vital signs: Reviewed and stable  Last Vitals:  Vitals Value Taken Time  BP 114/66 04/08/2018  1:08 PM  Temp    Pulse 94 04/08/2018  1:10 PM  Resp 14 04/08/2018  1:10 PM  SpO2 99 % 04/08/2018  1:10 PM  Vitals shown include unvalidated device data.  Last Pain:  Vitals:   04/08/18 1201  TempSrc:   PainSc: 10-Worst pain ever      Patients Stated Pain Goal: 5 (29/51/88 4166)  Complications: No apparent anesthesia complications

## 2018-04-08 NOTE — H&P (Signed)
History of Present Illness Cheyenne Levee MD; 04/05/2018 3:07 PM) The patient is a 52 year old female who presents with an anal fissure. 52 year old female who presents to the office today with chronic rectal pain. She reports an episode of rectal pain that started after a bout of constipation. She has occasional bright red bleeding. She has pain mostly after bowel movements. She has pain with sitting. She has been diagnosed with an anal fissure by her gastroenterologist and was started on nitroglycerin. She was then switched to diltiazem ointment but this caused the area to burn and she was switched back to nitroglycerin. She has been on one of these 2 appointments for the past 6-8 weeks with only a slight change in her symptoms. She has had an anal fissure before but this has resolved with conservative therapy.    Past Surgical History Sander Nephew, CMA; 04/05/2018 2:47 PM) Appendectomy  Hysterectomy (not due to cancer) - Partial  Spinal Surgery - Lower Back   Diagnostic Studies History Sander Nephew, CMA; 04/05/2018 2:47 PM) Colonoscopy  never Mammogram  1-3 years ago Pap Smear  1-5 years ago  Allergies Sander Nephew, CMA; 04/05/2018 2:47 PM) Sulfa Drugs  Allergies Reconciled   Medication History Sander Nephew, CMA; 04/05/2018 2:53 PM) ALPRAZolam (0.5MG  Tablet, Oral) Active. CeleXA (  Tablet, Oral) Active. Naproxen Sodium (  Tablet, Oral) Active. Flaxseed Oil (  Capsule, Oral) Active. Claritin-D 12 Hour (5-120MG  Tablet ER 12HR, Oral) Active. Azelastine HCl (0.1% Solution, Nasal) Active. Flexeril (  Tablet, Oral) Active. Bentyl (  Capsule, Oral) Active. Hyoscyamine Sulfate (0.125MG  Tablet, Oral) Active. DILTIAZEM Gel (2% Gel, External) Active. Lidocaine (5% Cream, External) Active. Medications Reconciled  Social History Sander Nephew, CMA; 04/05/2018 2:47 PM) Alcohol use  Occasional alcohol  use. Caffeine use  Coffee. No drug use  Tobacco use  Never smoker.  Family History Sander Nephew, CMA; 04/05/2018 2:47 PM) Hypertension  Father, Mother. Kidney Disease  Father. Thyroid problems  Mother.  Pregnancy / Birth History Sander Nephew, CMA; 04/05/2018 2:47 PM) Age at menarche  9 years. Age of menopause  10-50 Gravida  2 Maternal age  20-20 Para  2  Other Problems Sander Nephew, CMA; 04/05/2018 2:47 PM) Anxiety Disorder  Arthritis  Back Pain  Depression  General anesthesia - complications  Hemorrhoids  Kidney Stone     Review of Systems Duwayne Heck Gerrigner CMA; 04/05/2018 2:47 PM) General Present- Fatigue and Weight Gain. Not Present- Appetite Loss, Chills, Fever, Night Sweats and Weight Loss. Skin Not Present- Change in Wart/Mole, Dryness, Hives, Jaundice, New Lesions, Non-Healing Wounds, Rash and Ulcer. HEENT Present- Nose Bleed, Seasonal Allergies and Wears glasses/contact lenses. Not Present- Earache, Hearing Loss, Hoarseness, Oral Ulcers, Ringing in the Ears, Sinus Pain, Sore Throat, Visual Disturbances and Yellow Eyes. Respiratory Not Present- Bloody sputum, Chronic Cough, Difficulty Breathing, Snoring and Wheezing. Breast Not Present- Breast Mass, Breast Pain, Nipple Discharge and Skin Changes. Cardiovascular Not Present- Chest Pain, Difficulty Breathing Lying Down, Leg Cramps, Palpitations, Rapid Heart Rate, Shortness of Breath and Swelling of Extremities. Gastrointestinal Present- Bloating, Hemorrhoids and Rectal Pain. Not Present- Abdominal Pain, Bloody Stool, Change in Bowel Habits, Chronic diarrhea, Constipation, Difficulty Swallowing, Excessive gas, Gets full quickly at meals, Indigestion, Nausea and Vomiting. Musculoskeletal Present- Back Pain. Not Present- Joint Pain, Joint Stiffness, Muscle Pain, Muscle Weakness and Swelling of Extremities. Neurological Not Present- Decreased Memory, Fainting, Headaches, Numbness, Seizures,  Tingling, Tremor, Trouble walking and Weakness. Psychiatric Present- Anxiety and Depression. Not Present- Bipolar, Change in Sleep Pattern, Fearful and Frequent  crying. Endocrine Present- Hot flashes. Not Present- Cold Intolerance, Excessive Hunger, Hair Changes, Heat Intolerance and New Diabetes. Hematology Not Present- Blood Thinners, Easy Bruising, Excessive bleeding, Gland problems, HIV and Persistent Infections.  Vitals Duwayne Heck Gerrigner CMA; 04/05/2018 2:53 PM) 04/05/2018 2:53 PM Weight: 157.13 lb Height: 63in Body Surface Area: 1.75 m Body Mass Index: 27.83 kg/m  Temp.: 98.55F(Oral)  Pulse: 84 (Regular)  BP: 102/64 (Sitting, Left Arm, Standard)       Physical Exam Cheyenne Levee MD; 04/05/2018 3:07 PM) General Mental Status-Alert. General Appearance-Not in acute distress. Build & Nutrition-Well nourished. Posture-Normal posture. Gait-Normal.  Head and Neck Head-normocephalic, atraumatic with no lesions or palpable masses. Trachea-midline.  Chest and Lung Exam Chest and lung exam reveals -on auscultation, normal breath sounds, no adventitious sounds and normal vocal resonance.  Cardiovascular Cardiovascular examination reveals -normal heart sounds, regular rate and rhythm with no murmurs and no digital clubbing, cyanosis, edema, increased warmth or tenderness.  Abdomen Inspection Inspection of the abdomen reveals - No Hernias. Palpation/Percussion Palpation and Percussion of the abdomen reveal - Soft, Non Tender, No Rigidity (guarding), No hepatosplenomegaly and No Palpable abdominal masses.  Rectal Anorectal Exam External - anal fissure.  Neurologic Neurologic evaluation reveals -alert and oriented x 3 with no impairment of recent or remote memory, normal attention span and ability to concentrate, normal sensation and normal coordination.  Musculoskeletal Normal Exam - Bilateral-Upper Extremity Strength Normal and Lower  Extremity Strength Normal.    Assessment & Plan Cheyenne Levee MD; 04/05/2018 3:05 PM) ANAL FISSURE (K60.2) Impression: 52 year old female with chronic anal pain and diagnosis of chronic anal fissure. She has tried at least 6 weeks of diltiazem and nitroglycerin ointment with no improvement in her symptoms. On exam she has a clear posterior midline anal fissure. I recommended chemical sphincterotomy. We discussed alternative treatments as well. She will like to proceed with Botox injection. We will plan on getting this done as soon as possible. Risk included temporary fecal incontinence and recurrence.

## 2018-04-08 NOTE — Op Note (Signed)
04/08/2018  12:59 PM  PATIENT:  Cheyenne Ramirez  52 y.o. female  Patient Care Team: Patient, No Pcp Per as PCP - General (General Practice)  PRE-OPERATIVE DIAGNOSIS:  chronic anal fissure  POST-OPERATIVE DIAGNOSIS:  Chronic anal fissure  PROCEDURE:  ANAL EXAM UNDER ANESTHESIA CHEMICAL SPHINCTEROTOMY BOTOX   Surgeon(s): Leighton Ruff, MD  ASSISTANT: none   ANESTHESIA:   local and MAC  SPECIMEN:  No Specimen  DISPOSITION OF SPECIMEN:  N/A  COUNTS:  YES  PLAN OF CARE: Discharge to home after PACU  PATIENT DISPOSITION:  PACU - hemodynamically stable.  INDICATION: 52 year old female with chronic anal pain who has undergone medical treatment for an anal fissure for the past several weeks.   OR FINDINGS: Chronic anal fissure, grade 1 and grade 2 internal hemorrhoids without inflammation  DESCRIPTION: the patient was identified in the preoperative holding area and taken to the OR where they were laid on the operating room table.  MAC anesthesia was induced without difficulty. The patient was then positioned in prone jackknife position with buttocks gently taped apart.  The patient was then prepped and draped in usual sterile fashion.  SCDs were noted to be in place prior to the initiation of anesthesia. A surgical timeout was performed indicating the correct patient, procedure, positioning and need for preoperative antibiotics.  A rectal block was performed using Marcaine with epinephrine mixed with Exparel.    I began with a digital rectal exam.  There was significant sphincter hypertension.  There were no masses or fluctuance noted.  I then placed a Hill-Ferguson anoscope into the anal canal and evaluated this completely.  A chronic anal fissure could be seen at posterior midline.  The patient had a grade 2 left lateral internal hemorrhoid with no signs of inflammation.  The patient had grade 1 internal hemorrhoids in the right anterior and right posterior positions without  inflammation.  I injected 100 units of Botox mixed with saline into the intersphincteric groove.  This allowed for the chemical sphincterotomy.  The patient was then awakened from anesthesia and sent to the postanesthesia care unit in stable condition.  All counts were correct per operating room staff.

## 2018-04-08 NOTE — Anesthesia Preprocedure Evaluation (Signed)
Anesthesia Evaluation  Patient identified by MRN, date of birth, ID band Patient awake    Reviewed: Allergy & Precautions, NPO status , Patient's Chart, lab work & pertinent test results  History of Anesthesia Complications (+) PONV  Airway Mallampati: II  TM Distance: >3 FB Neck ROM: Full    Dental no notable dental hx.    Pulmonary neg pulmonary ROS,    Pulmonary exam normal breath sounds clear to auscultation       Cardiovascular negative cardio ROS Normal cardiovascular exam Rhythm:Regular Rate:Normal     Neuro/Psych negative neurological ROS  negative psych ROS   GI/Hepatic negative GI ROS, Neg liver ROS,   Endo/Other  negative endocrine ROS  Renal/GU negative Renal ROS  negative genitourinary   Musculoskeletal negative musculoskeletal ROS (+)   Abdominal   Peds negative pediatric ROS (+)  Hematology negative hematology ROS (+)   Anesthesia Other Findings   Reproductive/Obstetrics negative OB ROS                             Anesthesia Physical Anesthesia Plan  ASA: II  Anesthesia Plan: MAC   Post-op Pain Management:    Induction:   PONV Risk Score and Plan: 3 and Ondansetron and Treatment may vary due to age or medical condition  Airway Management Planned: Simple Face Mask  Additional Equipment:   Intra-op Plan:   Post-operative Plan:   Informed Consent: I have reviewed the patients History and Physical, chart, labs and discussed the procedure including the risks, benefits and alternatives for the proposed anesthesia with the patient or authorized representative who has indicated his/her understanding and acceptance.   Dental advisory given  Plan Discussed with: CRNA  Anesthesia Plan Comments:         Anesthesia Quick Evaluation

## 2018-04-09 NOTE — Anesthesia Postprocedure Evaluation (Signed)
Anesthesia Post Note  Patient: Cheyenne Ramirez  Procedure(s) Performed: ANAL EXAM UNDER ANESTHESIA (N/A ) CHEMICAL SPHINCTEROTOMY BOTOX (N/A )     Patient location during evaluation: PACU Anesthesia Type: MAC Level of consciousness: awake and alert Pain management: pain level controlled Vital Signs Assessment: post-procedure vital signs reviewed and stable Respiratory status: spontaneous breathing, nonlabored ventilation, respiratory function stable and patient connected to nasal cannula oxygen Cardiovascular status: stable and blood pressure returned to baseline Postop Assessment: no apparent nausea or vomiting Anesthetic complications: no    Last Vitals:  Vitals:   04/08/18 1345 04/08/18 1440  BP: 110/65 110/65  Pulse: 86 82  Resp: 18 14  Temp:  36.7 C  SpO2: 100% 100%    Last Pain:  Vitals:   04/08/18 1440  TempSrc: Oral  PainSc: 0-No pain                 Montez Hageman

## 2018-04-12 ENCOUNTER — Encounter (HOSPITAL_BASED_OUTPATIENT_CLINIC_OR_DEPARTMENT_OTHER): Payer: Self-pay | Admitting: General Surgery

## 2018-05-14 DIAGNOSIS — K602 Anal fissure, unspecified: Secondary | ICD-10-CM | POA: Diagnosis not present

## 2018-06-21 ENCOUNTER — Ambulatory Visit: Payer: Self-pay | Admitting: General Surgery

## 2018-06-21 NOTE — H&P (Signed)
History of Present Illness Cheyenne Ramirez(Dary Dilauro MD; 06/21/2018 1:55 PM) The patient is a 52 year old female who presents with an anal fissure. 52 year old female who presented to the office with chronic rectal pain. She reported this started after a bout of constipation. She has occasional bright red bleeding. She has pain mostly after bowel movements. She has pain with sitting. She has been diagnosed with an anal fissure by her gastroenterologist and was started on nitroglycerin. She was then switched to diltiazem ointment but this caused the area to burn and she was switched back to nitroglycerin. She was on one of these 2 oinments for 8 weeks total with only a slight change in her symptoms. She has had an anal fissure before but this has resolved with conservative therapy. She was taken to the OR in early May 2019 for chemical sphincterotomy. She returned to the office in early June 2019 with continued pain. Her symptoms have resolved for several weeks but then returned. Since then she continues to have pain on a daily basis. She reports large bowel movements but states that these are soft.     Problem List/Past Medical Cheyenne Ramirez(Scarlet Abad, MD; 06/21/2018 1:53 PM) Joesph FillersANAL FISSURE (K60.2)  Past Surgical History Cheyenne Ramirez(Charod Slawinski, MD; 06/21/2018 1:53 PM) Appendectomy Hysterectomy (not due to cancer) - Partial Spinal Surgery - Lower Back  Diagnostic Studies History Cheyenne Ramirez(Kasson Lamere, MD; 06/21/2018 1:53 PM) Colonoscopy never Mammogram 1-3 years ago Pap Smear 1-5 years ago  Allergies Michel Bickers(Kelly Dockery, LPN; 1/61/09607/15/2019 4:541:39 PM) Sulfa Drugs  Medication History Michel Bickers(Kelly Dockery, LPN; 0/98/11917/15/2019 4:781:40 PM) Ibuprofen (200MG  Tablet, Oral) Active. Tylenol Extra Strength (500MG  Tablet, Oral) Active. Flonase (50MCG/DOSE Inhaler, Nasal) Active. Colace (100MG  Capsule, Oral) Active. Lidocaine (5% Ointment, 1 External three times daily, as needed, Taken starting 05/14/2018) Active. ALPRAZolam (0.5MG   Tablet, Oral) Active. CeleXA (40MG  Tablet, Oral) Active. Naproxen Sodium (220MG  Tablet, Oral) Active. Flaxseed Oil (1000MG  Capsule, Oral) Active. Claritin-D 12 Hour (5-120MG  Tablet ER 12HR, Oral) Active. Azelastine HCl (0.1% Solution, Nasal) Active. Flexeril (10MG  Tablet, Oral) Active. Bentyl (20MG  Capsule, Oral) Active. Hyoscyamine Sulfate (0.125MG  Tablet, Oral) Active. Medications Reconciled  Social History Cheyenne Ramirez(Treyvion Durkee, MD; 06/21/2018 1:53 PM) Alcohol use Occasional alcohol use. Caffeine use Coffee. No drug use Tobacco use Never smoker.  Family History Cheyenne Ramirez(Lennex Pietila, MD; 06/21/2018 1:53 PM) Hypertension Father, Mother. Kidney Disease Father. Thyroid problems Mother.  Pregnancy / Birth History Cheyenne Ramirez(Avanna Sowder, MD; 06/21/2018 1:53 PM) Age at menarche 9 years. Age of menopause 3346-50 Gravida 2 Maternal age 54-20 Para 2  Other Problems Cheyenne Ramirez(Laycie Schriner, MD; 06/21/2018 1:53 PM) Anxiety Disorder Arthritis Back Pain Depression General anesthesia - complications Hemorrhoids Kidney Stone     Review of Systems Cheyenne Ramirez(Beverlee Wilmarth MD; 06/21/2018 1:53 PM) General Present- Fatigue and Weight Gain. Not Present- Appetite Loss, Chills, Fever, Night Sweats and Weight Loss. Skin Not Present- Change in Wart/Mole, Dryness, Hives, Jaundice, New Lesions, Non-Healing Wounds, Rash and Ulcer. HEENT Present- Nose Bleed, Seasonal Allergies and Wears glasses/contact lenses. Not Present- Earache, Hearing Loss, Hoarseness, Oral Ulcers, Ringing in the Ears, Sinus Pain, Sore Throat, Visual Disturbances and Yellow Eyes. Respiratory Not Present- Bloody sputum, Chronic Cough, Difficulty Breathing, Snoring and Wheezing. Breast Not Present- Breast Mass, Breast Pain, Nipple Discharge and Skin Changes. Cardiovascular Not Present- Chest Pain, Difficulty Breathing Lying Down, Leg Cramps, Palpitations, Rapid Heart Rate, Shortness of Breath and Swelling of Extremities. Gastrointestinal  Present- Bloating, Hemorrhoids and Rectal Pain. Not Present- Abdominal Pain, Bloody Stool, Change in Bowel Habits, Chronic diarrhea, Constipation, Difficulty Swallowing, Excessive  gas, Gets full quickly at meals, Indigestion, Nausea and Vomiting. Musculoskeletal Present- Back Pain. Not Present- Joint Pain, Joint Stiffness, Muscle Pain, Muscle Weakness and Swelling of Extremities. Neurological Not Present- Decreased Memory, Fainting, Headaches, Numbness, Seizures, Tingling, Tremor, Trouble walking and Weakness. Psychiatric Present- Anxiety and Depression. Not Present- Bipolar, Change in Sleep Pattern, Fearful and Frequent crying. Endocrine Present- Hot flashes. Not Present- Cold Intolerance, Excessive Hunger, Hair Changes, Heat Intolerance and New Diabetes. Hematology Not Present- Blood Thinners, Easy Bruising, Excessive bleeding, Gland problems, HIV and Persistent Infections.  Vitals Tresa Endo Dockery LPN; 03/16/8118 1:47 PM) 06/21/2018 1:39 PM Weight: 165.4 lb Height: 63in Body Surface Area: 1.78 m Body Mass Index: 29.3 kg/m  Temp.: 98.48F(Temporal)  Pulse: 72 (Regular)  BP: 118/64 (Sitting, Left Arm, Standard)      Physical Exam Cheyenne Levee MD; 06/21/2018 1:53 PM)  General Mental Status-Alert. General Appearance-Not in acute distress. Build & Nutrition-Well nourished. Posture-Normal posture. Gait-Normal.  Head and Neck Head-normocephalic, atraumatic with no lesions or palpable masses. Trachea-midline.  Chest and Lung Exam Chest and lung exam reveals -on auscultation, normal breath sounds, no adventitious sounds and normal vocal resonance.  Cardiovascular Cardiovascular examination reveals -normal heart sounds, regular rate and rhythm with no murmurs and no digital clubbing, cyanosis, edema, increased warmth or tenderness.  Abdomen Inspection Inspection of the abdomen reveals - No Hernias. Palpation/Percussion Palpation and Percussion of the  abdomen reveal - Soft, Non Tender, No Rigidity (guarding), No hepatosplenomegaly and No Palpable abdominal masses.  Rectal Anorectal Exam External - anal fissure.  Neurologic Neurologic evaluation reveals -alert and oriented x 3 with no impairment of recent or remote memory, normal attention span and ability to concentrate, normal sensation and normal coordination.  Musculoskeletal Normal Exam - Bilateral-Upper Extremity Strength Normal and Lower Extremity Strength Normal.    Assessment & Plan Cheyenne Levee MD; 06/21/2018 1:53 PM)  ANAL FISSURE (K60.2) Impression: 52 year old female who is status post chemical sphincterotomy in May 2019. She had a recurrence in June and this has continued despite medical treatment. We have discussed her treatment options which include continuing with medications versus repeating chemical sphincterotomy versus lateral internal sphincterotomy. We discussed the high chance of recurrence with chemical sphincterotomy given that she has already recurred less than 2 months after procedure. We also discussed a 20% risk of incontinence at some point in her life with lateral internal sphincterotomy. She has agreed to proceed with LIS and we will get this scheduled at her convenience.

## 2018-07-01 ENCOUNTER — Ambulatory Visit (HOSPITAL_BASED_OUTPATIENT_CLINIC_OR_DEPARTMENT_OTHER): Admit: 2018-07-01 | Payer: BLUE CROSS/BLUE SHIELD | Admitting: General Surgery

## 2018-07-01 ENCOUNTER — Encounter (HOSPITAL_BASED_OUTPATIENT_CLINIC_OR_DEPARTMENT_OTHER): Payer: Self-pay

## 2018-07-01 SURGERY — SPHINCTEROTOMY, ANAL
Anesthesia: Monitor Anesthesia Care

## 2018-09-23 DIAGNOSIS — Z23 Encounter for immunization: Secondary | ICD-10-CM | POA: Diagnosis not present

## 2018-10-12 DIAGNOSIS — K219 Gastro-esophageal reflux disease without esophagitis: Secondary | ICD-10-CM | POA: Diagnosis not present

## 2018-10-12 DIAGNOSIS — Z1211 Encounter for screening for malignant neoplasm of colon: Secondary | ICD-10-CM | POA: Diagnosis not present

## 2018-10-19 DIAGNOSIS — Z0000 Encounter for general adult medical examination without abnormal findings: Secondary | ICD-10-CM | POA: Diagnosis not present

## 2018-10-19 DIAGNOSIS — K580 Irritable bowel syndrome with diarrhea: Secondary | ICD-10-CM | POA: Diagnosis not present

## 2018-10-19 DIAGNOSIS — Z13220 Encounter for screening for lipoid disorders: Secondary | ICD-10-CM | POA: Diagnosis not present

## 2018-10-27 DIAGNOSIS — M654 Radial styloid tenosynovitis [de Quervain]: Secondary | ICD-10-CM | POA: Diagnosis not present

## 2019-02-01 ENCOUNTER — Ambulatory Visit (INDEPENDENT_AMBULATORY_CARE_PROVIDER_SITE_OTHER): Payer: Self-pay

## 2019-02-01 ENCOUNTER — Encounter (INDEPENDENT_AMBULATORY_CARE_PROVIDER_SITE_OTHER): Payer: Self-pay | Admitting: Orthopaedic Surgery

## 2019-02-01 ENCOUNTER — Ambulatory Visit (INDEPENDENT_AMBULATORY_CARE_PROVIDER_SITE_OTHER): Payer: Self-pay | Admitting: Orthopaedic Surgery

## 2019-02-01 DIAGNOSIS — M654 Radial styloid tenosynovitis [de Quervain]: Secondary | ICD-10-CM

## 2019-02-01 DIAGNOSIS — M67431 Ganglion, right wrist: Secondary | ICD-10-CM

## 2019-02-01 MED ORDER — METHYLPREDNISOLONE ACETATE 40 MG/ML IJ SUSP: 20.0000 mg | Freq: Once | INTRAMUSCULAR | Status: AC

## 2019-02-01 NOTE — Progress Notes (Signed)
Office Visit Note   Patient: Cheyenne Ramirez           Date of Birth: 08/20/1966           MRN: [id number] Visit Date: 02/01/2019              Requested by: No referring provider defined for this encounter. PCP: Patient, No Pcp Per   Assessment & Plan: Visit Diagnoses:  1. De Quervain's tenosynovitis, right   2. Ganglion of right wrist     Plan: Impression is right de Quervain's tenosynovitis as well as a ganglion cyst likely emanating from the first dorsal wrist compartment.  We discussed surgical versus nonsurgical treatment options and risks and benefits.  She would like to try repeat aspiration and injection today and I have recommended this to be done under ultrasound with Dr. Prince Rome.  She will let us know how she responds to this procedure.  I would recommend wearing the wrist brace for the next couple weeks.  Questions encouraged and answered.  Follow-up as needed.  Follow-Up Instructions: Return if symptoms worsen or fail to improve.   Orders:  Orders Placed This Encounter  Procedures  . XR Wrist Complete Right   No orders of the defined types were placed in this encounter.     Procedures: No procedures performed   Clinical Data: No additional findings.   Subjective: Chief Complaint  Patient presents with  . Right Wrist - Pain    Ms. Cheyenne Ramirez is a very pleasant 53 year old female who comes in with right radial sided wrist pain since October of last year.  She saw another orthopedic surgeon [place] and received a cortisone injection for suspected de Quervain's which helped temporarily.  Since then she is noticed that she is developed a mass just distal to the radial styloid.  Denies any constitutional symptoms or history of cancer.   Review of Systems  Constitutional: Negative.   HENT: Negative.   Eyes: Negative.   Respiratory: Negative.   Cardiovascular: Negative.   Endocrine: Negative.   Musculoskeletal: Negative.   Neurological: Negative.     Hematological: Negative.   Psychiatric/Behavioral: Negative.   All other systems reviewed and are negative.    Objective: Vital Signs: There were no vitals taken for this visit.  Physical Exam Vitals signs and nursing note reviewed.  Constitutional:      Appearance: She is well-developed.  HENT:     Head: Normocephalic and atraumatic.  Neck:     Musculoskeletal: Neck supple.  Pulmonary:     Effort: Pulmonary effort is normal.  Abdominal:     Palpations: Abdomen is soft.  Skin:    General: Skin is warm.     Capillary Refill: Capillary refill takes less than 2 seconds.  Neurological:     Mental Status: She is alert and oriented to person, place, and time.  Psychiatric:        Behavior: Behavior normal.        Thought Content: Thought content normal.        Judgment: Judgment normal.     Ortho Exam Right wrist exam shows a positive Finkelstein's.  Negative intersection.  She does have a palpable tender firm mass at the radial aspect of the wrist overlying the first dorsal wrist compartment. Specialty Comments:  No specialty comments available.  Imaging: Xr Wrist Complete Right  Result Date: 02/01/2019 No acute or structural abnormalities    PMFS History: There are no active problems to display for this patient.  Past Medical History:  Diagnosis Date  . Anxiety   . Arthritis   . Chronic anal fissure   . Depression   . History of kidney stones   . PONV (postoperative nausea and vomiting)   . Rectal pain   . Seasonal and perennial allergic rhinitis   . Wears glasses     History reviewed. No pertinent family history.  Past Surgical History:  Procedure Laterality Date  . APPENDECTOMY  age 30  . LAMINECTOMY AND MICRODISCECTOMY LUMBAR SPINE  10-09-2005     [facility]  . SPHINCTEROTOMY N/A 04/08/2018   Procedure: CHEMICAL SPHINCTEROTOMY BOTOX;  Surgeon: Romie Levee, MD;  Location: [facility];  Service: General;  Laterality: N/A;  . VAGINAL  HYSTERECTOMY  1993   Social History   Occupational History  . Not on file  Tobacco Use  . Smoking status: Never Smoker  . Smokeless tobacco: Never Used  Substance and Sexual Activity  . Alcohol use: Never    Frequency: Never  . Drug use: Never  . Sexual activity: Not on file

## 2019-02-01 NOTE — Addendum Note (Signed)
Addended by: Lillia Carmel on: 02/01/2019 03:03 PM   Modules accepted: Orders

## 2019-02-01 NOTE — Progress Notes (Signed)
Subjective: She is here for ultrasound-guided right wrist de Quervain's injection.  Symptoms since October.  Objective: Tender at the first dorsal compartment.  Tiny nodule palpable at the area of tenderness.  Imaging: Ultrasound was used to image the first dorsal compartment.  There is no septation, but there is swelling of the tendon sheath.  There is a tiny ganglion cyst associated with the tendon sheath.  Procedure: Ultrasound-guided right wrist de Quervain's injection: After sterile prep with Betadine, injected 2 cc 1% lidocaine without epinephrine and 20 mg methylprednisolone using a 27-gauge spinal needle, passing the needle multiple times through the ganglion cyst and injecting into it as well as into the tendon sheath.  She had postprocedure lightheadedness but otherwise tolerated it well.  Follow-up as directed.

## 2019-02-17 DIAGNOSIS — M542 Cervicalgia: Secondary | ICD-10-CM | POA: Diagnosis not present

## 2019-02-17 DIAGNOSIS — M47812 Spondylosis without myelopathy or radiculopathy, cervical region: Secondary | ICD-10-CM | POA: Diagnosis not present

## 2019-02-21 DIAGNOSIS — M5030 Other cervical disc degeneration, unspecified cervical region: Secondary | ICD-10-CM | POA: Diagnosis not present

## 2019-02-21 DIAGNOSIS — M654 Radial styloid tenosynovitis [de Quervain]: Secondary | ICD-10-CM | POA: Diagnosis not present

## 2019-02-21 DIAGNOSIS — R202 Paresthesia of skin: Secondary | ICD-10-CM | POA: Diagnosis not present

## 2019-02-21 DIAGNOSIS — M545 Low back pain: Secondary | ICD-10-CM | POA: Diagnosis not present

## 2019-03-24 DIAGNOSIS — M545 Low back pain: Secondary | ICD-10-CM | POA: Diagnosis not present

## 2019-03-24 DIAGNOSIS — M5030 Other cervical disc degeneration, unspecified cervical region: Secondary | ICD-10-CM | POA: Diagnosis not present

## 2019-03-24 DIAGNOSIS — J309 Allergic rhinitis, unspecified: Secondary | ICD-10-CM | POA: Diagnosis not present

## 2019-03-24 DIAGNOSIS — G4700 Insomnia, unspecified: Secondary | ICD-10-CM | POA: Diagnosis not present

## 2019-06-07 DIAGNOSIS — Z20828 Contact with and (suspected) exposure to other viral communicable diseases: Secondary | ICD-10-CM | POA: Diagnosis not present

## 2019-06-07 DIAGNOSIS — R509 Fever, unspecified: Secondary | ICD-10-CM | POA: Diagnosis not present

## 2019-06-15 ENCOUNTER — Other Ambulatory Visit: Payer: BLUE CROSS/BLUE SHIELD

## 2019-06-15 ENCOUNTER — Telehealth: Payer: Self-pay | Admitting: Family Medicine

## 2019-06-15 DIAGNOSIS — Z20822 Contact with and (suspected) exposure to covid-19: Secondary | ICD-10-CM

## 2019-06-15 DIAGNOSIS — R6889 Other general symptoms and signs: Secondary | ICD-10-CM | POA: Diagnosis not present

## 2019-06-15 NOTE — Telephone Encounter (Signed)
rec'd referral for COVID testing from Gaye Alken, NP, with [facility], for known exposure to [place].   Phone call to pt.  Scheduled for an appt. Today at 2:45 PM at the [facility] testing site.  Advised to wear a mask and remain in the car for testing.  Verb. Understanding.

## 2019-06-20 LAB — NOVEL CORONAVIRUS, NAA: SARS-CoV-2, NAA: NOT DETECTED

## 2019-08-25 ENCOUNTER — Ambulatory Visit (INDEPENDENT_AMBULATORY_CARE_PROVIDER_SITE_OTHER): Payer: BC Managed Care – PPO | Admitting: Family Medicine

## 2019-08-25 ENCOUNTER — Encounter: Payer: Self-pay | Admitting: Family Medicine

## 2019-08-25 DIAGNOSIS — M654 Radial styloid tenosynovitis [de Quervain]: Secondary | ICD-10-CM | POA: Diagnosis not present

## 2019-08-25 NOTE — Progress Notes (Signed)
   Office Visit Note   Patient: Cheyenne Ramirez           Date of Birth: 1966-04-27           MRN: [id number] Visit Date: 08/25/2019 Requested by: No referring provider defined for this encounter. PCP: Patient, No Pcp Per  Subjective: Chief Complaint  Patient presents with  . Right Wrist - Pain    Pain flared up again 2 weeks ago. De Quervain's cortisone injection 02/01/19 helped. Still wears removable wrist splint at times.    HPI: She is here with recurrent right wrist pain.  We injected her wrist in February with good results.  She was completely asymptomatic until 2 weeks ago.  She does not know what caused her wrist to start hurting but it hurts again on the radial side.              ROS:   All other systems were reviewed and are negative.  Objective: Vital Signs: There were no vitals taken for this visit.  Physical Exam:  General:  Alert and oriented, in no acute distress. Pulm:  Breathing unlabored. Psy:  Normal mood, congruent affect. Skin: No erythema Right wrist: Tender at the first dorsal compartment with positive Finkelstein test, tender nodule over the radial styloid.  Imaging: Limited diagnostic ultrasound reveals changes of de Quervain's tenosynovitis with a ganglion cyst.  Assessment & Plan: 1.  Recurrent right wrist de Quervain's tenosynovitis -Discussed options with her and she wants to try another injection.  If it only gives short-term relief could consider surgical release per Dr. Erlinda Hong.     Procedures: Ultrasound-guided right wrist injection: After sterile prep with 1 cc 1% lidocaine without epinephrine and 20 mg methylprednisolone using ultrasound to guide needle placement into the small ganglion cyst and the first dorsal compartment.    PMFS History: There are no active problems to display for this patient.  Past Medical History:  Diagnosis Date  . Anxiety   . Arthritis   . Chronic anal fissure   . Depression   . History of kidney stones   .  PONV (postoperative nausea and vomiting)   . Rectal pain   . Seasonal and perennial allergic rhinitis   . Wears glasses     History reviewed. No pertinent family history.  Past Surgical History:  Procedure Laterality Date  . APPENDECTOMY  age 58  . LAMINECTOMY AND MICRODISCECTOMY LUMBAR SPINE  10-09-2005     [facility]  . SPHINCTEROTOMY N/A 04/08/2018   Procedure: CHEMICAL SPHINCTEROTOMY BOTOX;  Surgeon: Leighton Ruff, MD;  Location: [facility];  Service: General;  Laterality: N/A;  . [place] History   Occupational History  . Not on file  Tobacco Use  . Smoking status: Never Smoker  . Smokeless tobacco: Never Used  Substance and Sexual Activity  . Alcohol use: Never    Frequency: Never  . Drug use: Never  . Sexual activity: Not on file

## 2019-10-20 DIAGNOSIS — M545 Low back pain: Secondary | ICD-10-CM | POA: Diagnosis not present

## 2019-10-20 DIAGNOSIS — K580 Irritable bowel syndrome with diarrhea: Secondary | ICD-10-CM | POA: Diagnosis not present

## 2019-10-20 DIAGNOSIS — F419 Anxiety disorder, unspecified: Secondary | ICD-10-CM | POA: Diagnosis not present

## 2019-10-20 DIAGNOSIS — M5030 Other cervical disc degeneration, unspecified cervical region: Secondary | ICD-10-CM | POA: Diagnosis not present

## 2019-11-21 ENCOUNTER — Other Ambulatory Visit: Payer: Self-pay | Admitting: Family Medicine

## 2020-01-22 DIAGNOSIS — Z20822 Contact with and (suspected) exposure to covid-19: Secondary | ICD-10-CM | POA: Diagnosis not present

## 2020-01-25 DIAGNOSIS — B9689 Other specified bacterial agents as the cause of diseases classified elsewhere: Secondary | ICD-10-CM | POA: Diagnosis not present

## 2020-01-25 DIAGNOSIS — J019 Acute sinusitis, unspecified: Secondary | ICD-10-CM | POA: Diagnosis not present

## 2020-04-19 DIAGNOSIS — J309 Allergic rhinitis, unspecified: Secondary | ICD-10-CM | POA: Diagnosis not present

## 2020-04-19 DIAGNOSIS — M545 Low back pain: Secondary | ICD-10-CM | POA: Diagnosis not present

## 2020-04-19 DIAGNOSIS — M5030 Other cervical disc degeneration, unspecified cervical region: Secondary | ICD-10-CM | POA: Diagnosis not present

## 2020-04-19 DIAGNOSIS — G4700 Insomnia, unspecified: Secondary | ICD-10-CM | POA: Diagnosis not present

## 2020-06-01 DIAGNOSIS — Z1231 Encounter for screening mammogram for malignant neoplasm of breast: Secondary | ICD-10-CM | POA: Diagnosis not present

## 2020-06-05 ENCOUNTER — Ambulatory Visit
Admission: RE | Admit: 2020-06-05 | Discharge: 2020-06-05 | Disposition: A | Discharge status: Home / Self Care | Payer: BC Managed Care – PPO | Source: Ambulatory Visit | Attending: Family Medicine | Admitting: Family Medicine

## 2020-06-05 ENCOUNTER — Other Ambulatory Visit: Payer: Self-pay | Admitting: Family Medicine

## 2020-06-05 DIAGNOSIS — M5489 Other dorsalgia: Secondary | ICD-10-CM

## 2020-06-05 DIAGNOSIS — M545 Low back pain: Secondary | ICD-10-CM | POA: Diagnosis not present

## 2020-06-08 DIAGNOSIS — Z0000 Encounter for general adult medical examination without abnormal findings: Secondary | ICD-10-CM | POA: Diagnosis not present

## 2020-06-08 DIAGNOSIS — Z13220 Encounter for screening for lipoid disorders: Secondary | ICD-10-CM | POA: Diagnosis not present

## 2020-06-26 DIAGNOSIS — M545 Low back pain: Secondary | ICD-10-CM | POA: Diagnosis not present

## 2020-06-26 DIAGNOSIS — G8929 Other chronic pain: Secondary | ICD-10-CM | POA: Diagnosis not present

## 2020-06-28 ENCOUNTER — Other Ambulatory Visit: Payer: Self-pay | Admitting: Student

## 2020-06-28 DIAGNOSIS — G8929 Other chronic pain: Secondary | ICD-10-CM

## 2020-07-13 DIAGNOSIS — Z20822 Contact with and (suspected) exposure to covid-19: Secondary | ICD-10-CM | POA: Diagnosis not present

## 2020-07-18 DIAGNOSIS — U071 COVID-19: Secondary | ICD-10-CM | POA: Diagnosis not present

## 2020-07-22 DIAGNOSIS — U071 COVID-19: Secondary | ICD-10-CM | POA: Diagnosis not present

## 2020-07-22 DIAGNOSIS — R0602 Shortness of breath: Secondary | ICD-10-CM | POA: Diagnosis not present

## 2020-07-26 ENCOUNTER — Other Ambulatory Visit: Payer: BC Managed Care – PPO

## 2020-08-21 ENCOUNTER — Other Ambulatory Visit: Payer: Self-pay

## 2020-08-21 ENCOUNTER — Ambulatory Visit
Admission: RE | Admit: 2020-08-21 | Discharge: 2020-08-21 | Disposition: A | Discharge status: Home / Self Care | Payer: BC Managed Care – PPO | Source: Ambulatory Visit | Attending: Student | Admitting: Student

## 2020-08-21 DIAGNOSIS — G8929 Other chronic pain: Secondary | ICD-10-CM

## 2020-08-21 DIAGNOSIS — M47816 Spondylosis without myelopathy or radiculopathy, lumbar region: Secondary | ICD-10-CM | POA: Diagnosis not present

## 2020-08-21 DIAGNOSIS — M4316 Spondylolisthesis, lumbar region: Secondary | ICD-10-CM | POA: Diagnosis not present

## 2020-08-21 DIAGNOSIS — M48061 Spinal stenosis, lumbar region without neurogenic claudication: Secondary | ICD-10-CM | POA: Diagnosis not present

## 2020-08-21 DIAGNOSIS — M5137 Other intervertebral disc degeneration, lumbosacral region: Secondary | ICD-10-CM | POA: Diagnosis not present

## 2020-08-21 MED ORDER — GADOBENATE DIMEGLUMINE 529 MG/ML IV SOLN
14.0000 mL | Freq: Once | INTRAVENOUS | Status: AC | PRN
  Administered 2020-08-21: 14 mL via INTRAVENOUS

## 2020-08-28 DIAGNOSIS — R030 Elevated blood-pressure reading, without diagnosis of hypertension: Secondary | ICD-10-CM | POA: Diagnosis not present

## 2020-08-28 DIAGNOSIS — M545 Low back pain: Secondary | ICD-10-CM | POA: Diagnosis not present

## 2020-10-17 DIAGNOSIS — M5030 Other cervical disc degeneration, unspecified cervical region: Secondary | ICD-10-CM | POA: Diagnosis not present

## 2020-10-17 DIAGNOSIS — G4700 Insomnia, unspecified: Secondary | ICD-10-CM | POA: Diagnosis not present

## 2020-10-17 DIAGNOSIS — M545 Low back pain, unspecified: Secondary | ICD-10-CM | POA: Diagnosis not present

## 2020-10-17 DIAGNOSIS — J309 Allergic rhinitis, unspecified: Secondary | ICD-10-CM | POA: Diagnosis not present

## 2020-12-07 DIAGNOSIS — T7840XA Allergy, unspecified, initial encounter: Secondary | ICD-10-CM | POA: Diagnosis not present

## 2020-12-07 DIAGNOSIS — L509 Urticaria, unspecified: Secondary | ICD-10-CM | POA: Diagnosis not present

## 2020-12-07 DIAGNOSIS — R609 Edema, unspecified: Secondary | ICD-10-CM | POA: Diagnosis not present

## 2020-12-07 DIAGNOSIS — T782XXA Anaphylactic shock, unspecified, initial encounter: Secondary | ICD-10-CM | POA: Diagnosis not present

## 2020-12-13 DIAGNOSIS — R030 Elevated blood-pressure reading, without diagnosis of hypertension: Secondary | ICD-10-CM | POA: Diagnosis not present

## 2021-01-13 IMAGING — MR MR LUMBAR SPINE WO/W CM
4 of 7 series · 20 of 48 positions shown · IV contrast (multihance)
Comparison: Lumbar spine radiographs 06/05/2020

CLINICAL DATA: Chronic bilateral low back pain without sciatica.

EXAM:
MRI LUMBAR SPINE WITHOUT AND WITH CONTRAST
TECHNIQUE: Multiplanar and multiecho pulse sequences of the lumbar spine were
obtained without and with intravenous contrast.
CONTRAST:  14mL MULTIHANCE GADOBENATE DIMEGLUMINE 529 MG/ML IV SOLN

[Series 5: T1 · sagittal · 4.0mm · 0.73mm/px · 5 of 15 slices shown (1 of 2)]
[im 1/15]
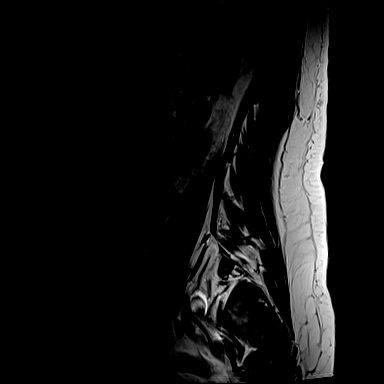
[im 4/15]
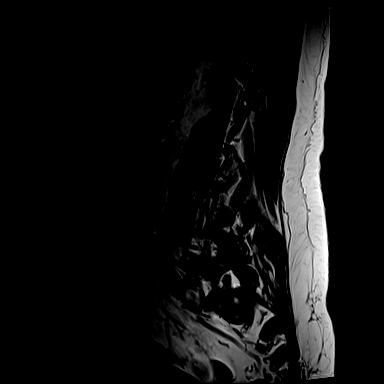
[im 8/15]
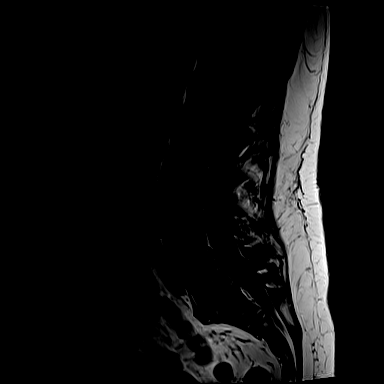
[im 11/15]
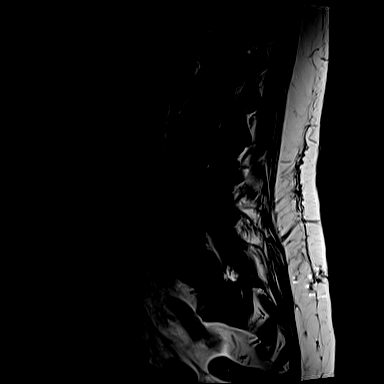
[im 15/15]
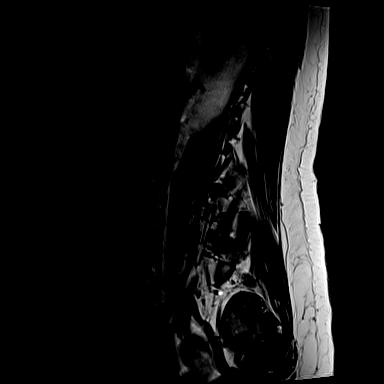

[Series 9: T1 · axial · 4.0mm · 0.28mm/px · z∈[-67,+100]mm · 3 of 37 slices shown (2 of 2)]
[im 5/37]
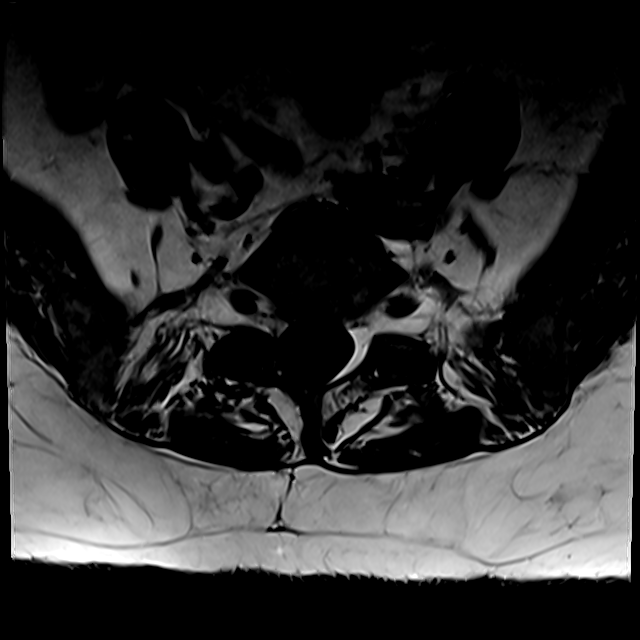
[im 21/37]
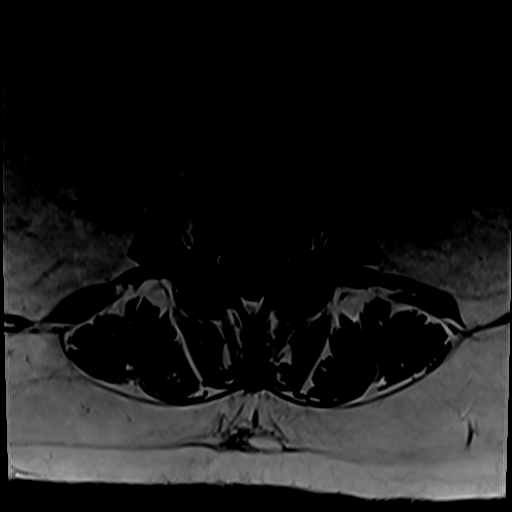
[im 33/37]
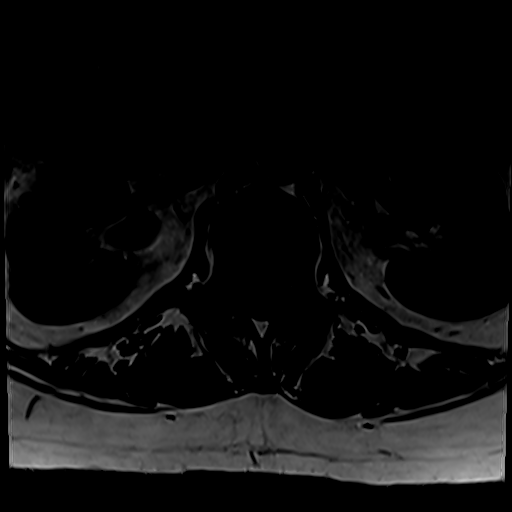

[Series 12: T2 · axial · 4.0mm · 0.28mm/px · z∈[-86,+120]mm · 8 of 37 slices shown (1 of 2)]
[im 1/37]
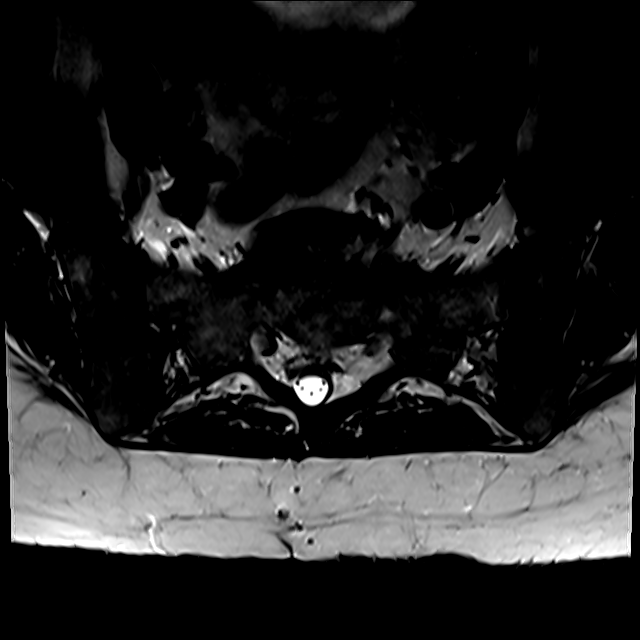
[im 5/37]
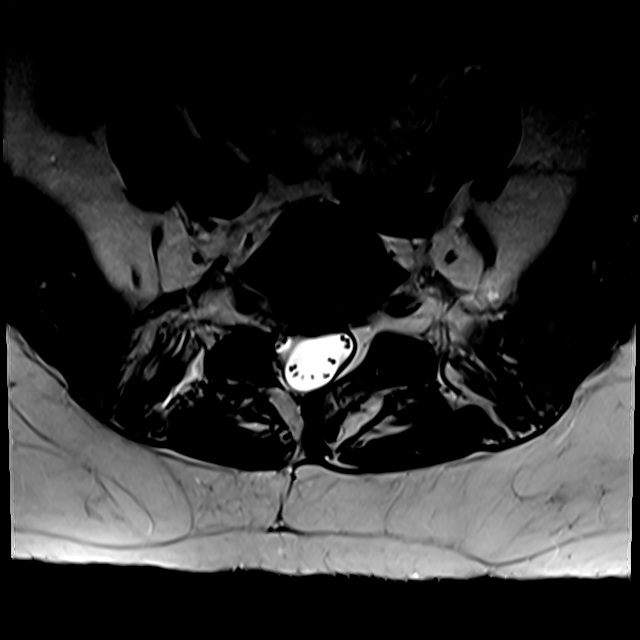
[im 13/37]
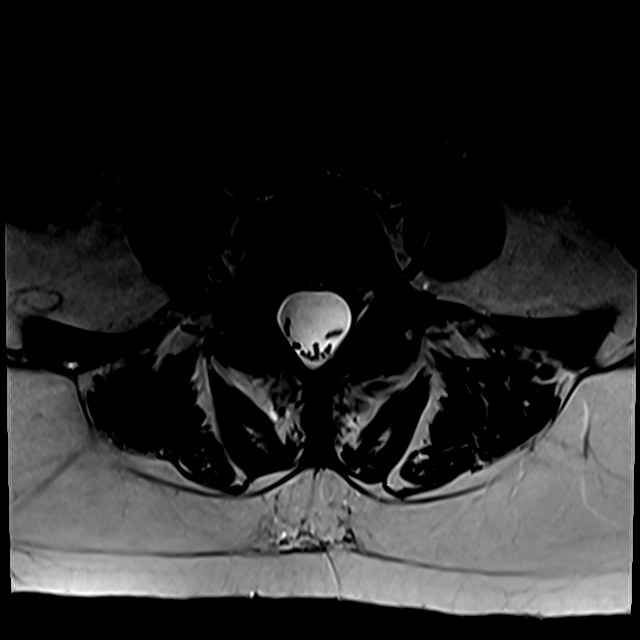
[im 17/37]
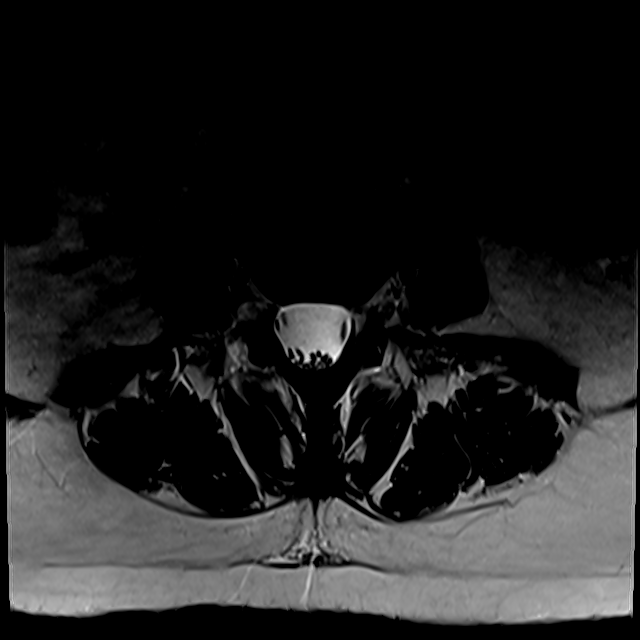
[im 21/37]
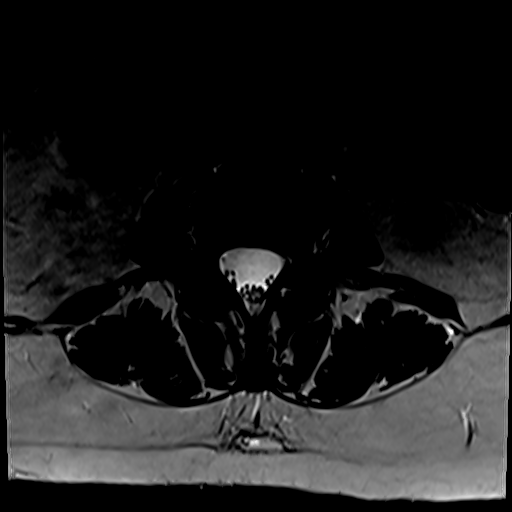
[im 25/37]
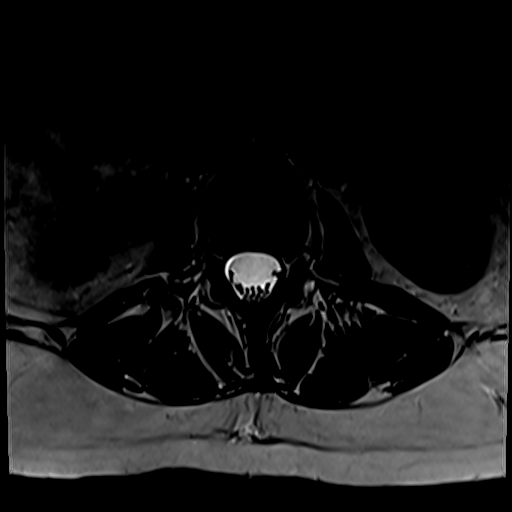
[im 33/37]
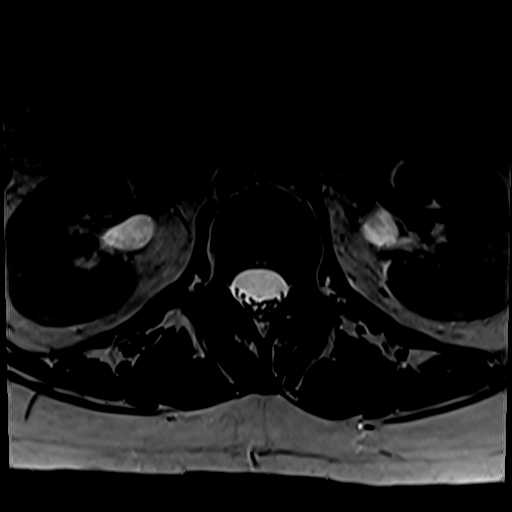
[im 37/37]
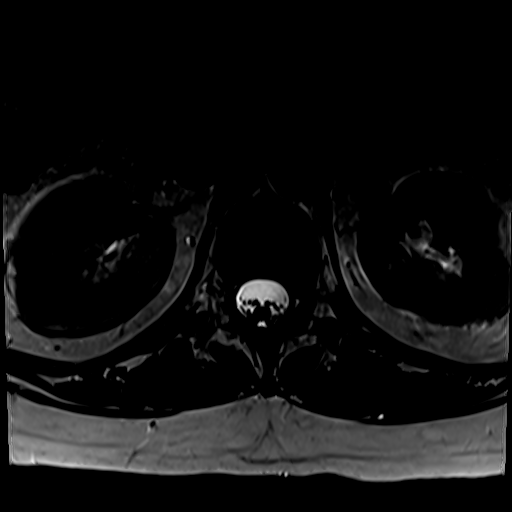

[Series 13: T2 · sagittal · 4.0mm · 0.73mm/px · 4 of 15 slices shown (2 of 2)]
[im 1/15]
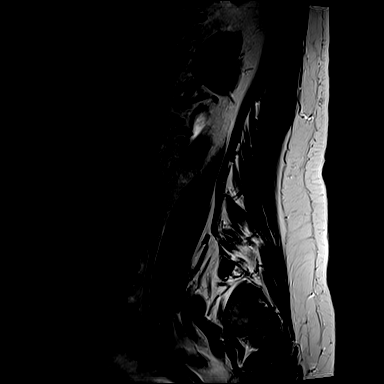
[im 5/15]
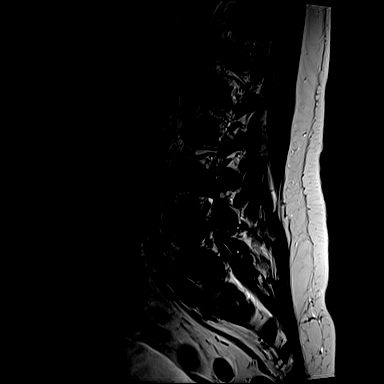
[im 10/15]
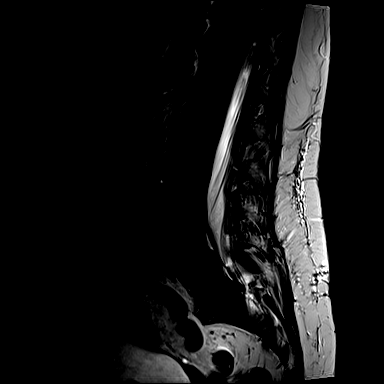
[im 15/15]
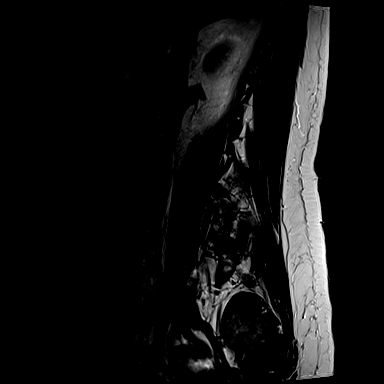

[20 of 48 positions shown; findings below may reference images not displayed]

FINDINGS: Segmentation:  Normal

Alignment:  Slight anterolisthesis L4-5 otherwise normal alignment

Vertebrae:  Normal bone marrow.  Negative for fracture or mass.

Conus medullaris and cauda equina: Conus extends to the L1 level.
Conus and cauda equina appear normal.

Paraspinal and other soft tissues: Negative for paraspinous mass or
adenopathy or fluid collection.

Disc levels:

T12-L1: Negative

L1-2: Mild disc degeneration. Negative for disc protrusion or
stenosis

L2-3: Mild disc degeneration negative for disc protrusion or
stenosis

L3-4: Negative

L4-5: Mild anterolisthesis. Mild facet degeneration. Negative for
disc protrusion or stenosis

L5-S1: Moderate disc degeneration with disc space narrowing and
endplate spurring. No significant spinal or foraminal stenosis.
IMPRESSION: Mild lumbar degenerative change. Mild degenerative anterolisthesis
L4-5

Negative for neural impingement.

## 2021-03-13 DIAGNOSIS — R030 Elevated blood-pressure reading, without diagnosis of hypertension: Secondary | ICD-10-CM | POA: Diagnosis not present

## 2021-03-13 DIAGNOSIS — M545 Low back pain, unspecified: Secondary | ICD-10-CM | POA: Diagnosis not present

## 2021-03-13 DIAGNOSIS — F419 Anxiety disorder, unspecified: Secondary | ICD-10-CM | POA: Diagnosis not present

## 2021-04-18 DIAGNOSIS — M5030 Other cervical disc degeneration, unspecified cervical region: Secondary | ICD-10-CM | POA: Diagnosis not present

## 2021-04-18 DIAGNOSIS — G4700 Insomnia, unspecified: Secondary | ICD-10-CM | POA: Diagnosis not present

## 2021-04-18 DIAGNOSIS — M545 Low back pain, unspecified: Secondary | ICD-10-CM | POA: Diagnosis not present

## 2021-04-18 DIAGNOSIS — J309 Allergic rhinitis, unspecified: Secondary | ICD-10-CM | POA: Diagnosis not present

## 2021-06-07 DIAGNOSIS — Z1231 Encounter for screening mammogram for malignant neoplasm of breast: Secondary | ICD-10-CM | POA: Diagnosis not present

## 2021-08-10 DIAGNOSIS — Z20822 Contact with and (suspected) exposure to covid-19: Secondary | ICD-10-CM | POA: Diagnosis not present

## 2021-10-23 DIAGNOSIS — F419 Anxiety disorder, unspecified: Secondary | ICD-10-CM | POA: Diagnosis not present

## 2021-10-23 DIAGNOSIS — K580 Irritable bowel syndrome with diarrhea: Secondary | ICD-10-CM | POA: Diagnosis not present

## 2021-10-23 DIAGNOSIS — Z23 Encounter for immunization: Secondary | ICD-10-CM | POA: Diagnosis not present

## 2021-10-23 DIAGNOSIS — J309 Allergic rhinitis, unspecified: Secondary | ICD-10-CM | POA: Diagnosis not present

## 2021-10-23 DIAGNOSIS — M545 Low back pain, unspecified: Secondary | ICD-10-CM | POA: Diagnosis not present

## 2021-12-18 DIAGNOSIS — H5203 Hypermetropia, bilateral: Secondary | ICD-10-CM | POA: Diagnosis not present

## 2021-12-18 DIAGNOSIS — H524 Presbyopia: Secondary | ICD-10-CM | POA: Diagnosis not present

## 2021-12-18 DIAGNOSIS — H52223 Regular astigmatism, bilateral: Secondary | ICD-10-CM | POA: Diagnosis not present

## 2022-01-30 DIAGNOSIS — H04123 Dry eye syndrome of bilateral lacrimal glands: Secondary | ICD-10-CM | POA: Diagnosis not present

## 2022-01-30 DIAGNOSIS — H524 Presbyopia: Secondary | ICD-10-CM | POA: Diagnosis not present

## 2022-01-30 DIAGNOSIS — H40013 Open angle with borderline findings, low risk, bilateral: Secondary | ICD-10-CM | POA: Diagnosis not present

## 2022-01-30 DIAGNOSIS — H25813 Combined forms of age-related cataract, bilateral: Secondary | ICD-10-CM | POA: Diagnosis not present

## 2022-04-16 DIAGNOSIS — Z130 Encounter for screening for diseases of the blood and blood-forming organs and certain disorders involving the immune mechanism: Secondary | ICD-10-CM | POA: Diagnosis not present

## 2022-04-16 DIAGNOSIS — Z13220 Encounter for screening for lipoid disorders: Secondary | ICD-10-CM | POA: Diagnosis not present

## 2022-04-16 DIAGNOSIS — Z0000 Encounter for general adult medical examination without abnormal findings: Secondary | ICD-10-CM | POA: Diagnosis not present

## 2022-04-17 DIAGNOSIS — G4700 Insomnia, unspecified: Secondary | ICD-10-CM | POA: Diagnosis not present

## 2022-04-17 DIAGNOSIS — Z0000 Encounter for general adult medical examination without abnormal findings: Secondary | ICD-10-CM | POA: Diagnosis not present

## 2022-04-17 DIAGNOSIS — M545 Low back pain, unspecified: Secondary | ICD-10-CM | POA: Diagnosis not present

## 2022-04-17 DIAGNOSIS — J309 Allergic rhinitis, unspecified: Secondary | ICD-10-CM | POA: Diagnosis not present

## 2022-04-17 DIAGNOSIS — Z23 Encounter for immunization: Secondary | ICD-10-CM | POA: Diagnosis not present

## 2022-04-17 DIAGNOSIS — K580 Irritable bowel syndrome with diarrhea: Secondary | ICD-10-CM | POA: Diagnosis not present

## 2022-06-13 DIAGNOSIS — Z1231 Encounter for screening mammogram for malignant neoplasm of breast: Secondary | ICD-10-CM | POA: Diagnosis not present

## 2022-06-30 DIAGNOSIS — M9902 Segmental and somatic dysfunction of thoracic region: Secondary | ICD-10-CM | POA: Diagnosis not present

## 2022-06-30 DIAGNOSIS — M9901 Segmental and somatic dysfunction of cervical region: Secondary | ICD-10-CM | POA: Diagnosis not present

## 2022-06-30 DIAGNOSIS — M5384 Other specified dorsopathies, thoracic region: Secondary | ICD-10-CM | POA: Diagnosis not present

## 2022-06-30 DIAGNOSIS — M50322 Other cervical disc degeneration at C5-C6 level: Secondary | ICD-10-CM | POA: Diagnosis not present

## 2022-07-01 DIAGNOSIS — M5032 Other cervical disc degeneration, mid-cervical region, unspecified level: Secondary | ICD-10-CM | POA: Diagnosis not present

## 2022-07-21 DIAGNOSIS — K580 Irritable bowel syndrome with diarrhea: Secondary | ICD-10-CM | POA: Diagnosis not present

## 2022-07-21 DIAGNOSIS — J309 Allergic rhinitis, unspecified: Secondary | ICD-10-CM | POA: Diagnosis not present

## 2022-07-21 DIAGNOSIS — M5030 Other cervical disc degeneration, unspecified cervical region: Secondary | ICD-10-CM | POA: Diagnosis not present

## 2022-07-21 DIAGNOSIS — F419 Anxiety disorder, unspecified: Secondary | ICD-10-CM | POA: Diagnosis not present

## 2022-07-28 DIAGNOSIS — M542 Cervicalgia: Secondary | ICD-10-CM | POA: Diagnosis not present

## 2022-08-19 ENCOUNTER — Ambulatory Visit: Payer: BC Managed Care – PPO

## 2022-08-26 DIAGNOSIS — M542 Cervicalgia: Secondary | ICD-10-CM | POA: Diagnosis not present

## 2022-12-16 DIAGNOSIS — R509 Fever, unspecified: Secondary | ICD-10-CM | POA: Diagnosis not present

## 2022-12-16 DIAGNOSIS — Z20822 Contact with and (suspected) exposure to covid-19: Secondary | ICD-10-CM | POA: Diagnosis not present

## 2022-12-16 DIAGNOSIS — R6889 Other general symptoms and signs: Secondary | ICD-10-CM | POA: Diagnosis not present

## 2022-12-16 DIAGNOSIS — U071 COVID-19: Secondary | ICD-10-CM | POA: Diagnosis not present

## 2023-01-14 DIAGNOSIS — H52223 Regular astigmatism, bilateral: Secondary | ICD-10-CM | POA: Diagnosis not present

## 2023-01-14 DIAGNOSIS — H524 Presbyopia: Secondary | ICD-10-CM | POA: Diagnosis not present

## 2023-01-14 DIAGNOSIS — H5203 Hypermetropia, bilateral: Secondary | ICD-10-CM | POA: Diagnosis not present

## 2023-01-26 DIAGNOSIS — M5030 Other cervical disc degeneration, unspecified cervical region: Secondary | ICD-10-CM | POA: Diagnosis not present

## 2023-01-26 DIAGNOSIS — K580 Irritable bowel syndrome with diarrhea: Secondary | ICD-10-CM | POA: Diagnosis not present

## 2023-01-26 DIAGNOSIS — J309 Allergic rhinitis, unspecified: Secondary | ICD-10-CM | POA: Diagnosis not present

## 2023-01-26 DIAGNOSIS — F419 Anxiety disorder, unspecified: Secondary | ICD-10-CM | POA: Diagnosis not present

## 2023-06-26 DIAGNOSIS — Z1231 Encounter for screening mammogram for malignant neoplasm of breast: Secondary | ICD-10-CM | POA: Diagnosis not present

## 2023-07-23 DIAGNOSIS — J309 Allergic rhinitis, unspecified: Secondary | ICD-10-CM | POA: Diagnosis not present

## 2023-07-23 DIAGNOSIS — R5383 Other fatigue: Secondary | ICD-10-CM | POA: Diagnosis not present

## 2023-07-23 DIAGNOSIS — K580 Irritable bowel syndrome with diarrhea: Secondary | ICD-10-CM | POA: Diagnosis not present

## 2023-07-23 DIAGNOSIS — F419 Anxiety disorder, unspecified: Secondary | ICD-10-CM | POA: Diagnosis not present

## 2023-07-23 DIAGNOSIS — M5030 Other cervical disc degeneration, unspecified cervical region: Secondary | ICD-10-CM | POA: Diagnosis not present

## 2023-07-23 DIAGNOSIS — D72829 Elevated white blood cell count, unspecified: Secondary | ICD-10-CM | POA: Diagnosis not present

## 2023-08-03 DIAGNOSIS — D72829 Elevated white blood cell count, unspecified: Secondary | ICD-10-CM | POA: Diagnosis not present

## 2023-10-25 DIAGNOSIS — H60392 Other infective otitis externa, left ear: Secondary | ICD-10-CM | POA: Diagnosis not present

## 2023-11-23 DIAGNOSIS — L209 Atopic dermatitis, unspecified: Secondary | ICD-10-CM | POA: Diagnosis not present

## 2024-01-28 DIAGNOSIS — K580 Irritable bowel syndrome with diarrhea: Secondary | ICD-10-CM | POA: Diagnosis not present

## 2024-01-28 DIAGNOSIS — F419 Anxiety disorder, unspecified: Secondary | ICD-10-CM | POA: Diagnosis not present

## 2024-01-28 DIAGNOSIS — J309 Allergic rhinitis, unspecified: Secondary | ICD-10-CM | POA: Diagnosis not present

## 2024-01-28 DIAGNOSIS — M5030 Other cervical disc degeneration, unspecified cervical region: Secondary | ICD-10-CM | POA: Diagnosis not present

## 2024-04-19 DIAGNOSIS — R040 Epistaxis: Secondary | ICD-10-CM | POA: Diagnosis not present

## 2024-04-25 ENCOUNTER — Ambulatory Visit (HOSPITAL_BASED_OUTPATIENT_CLINIC_OR_DEPARTMENT_OTHER): Admitting: Student

## 2024-04-25 ENCOUNTER — Encounter (HOSPITAL_BASED_OUTPATIENT_CLINIC_OR_DEPARTMENT_OTHER): Payer: Self-pay | Admitting: Student

## 2024-04-25 ENCOUNTER — Ambulatory Visit (HOSPITAL_BASED_OUTPATIENT_CLINIC_OR_DEPARTMENT_OTHER)

## 2024-04-25 DIAGNOSIS — M25561 Pain in right knee: Secondary | ICD-10-CM | POA: Diagnosis not present

## 2024-04-25 DIAGNOSIS — M1711 Unilateral primary osteoarthritis, right knee: Secondary | ICD-10-CM | POA: Diagnosis not present

## 2024-04-25 DIAGNOSIS — M25461 Effusion, right knee: Secondary | ICD-10-CM | POA: Diagnosis not present

## 2024-04-25 MED ORDER — TRIAMCINOLONE ACETONIDE 40 MG/ML IJ SUSP
2.0000 mL | INTRAMUSCULAR | Status: AC | PRN
  Administered 2024-04-25: 2 mL via INTRA_ARTICULAR

## 2024-04-25 MED ORDER — LIDOCAINE HCL 1 % IJ SOLN
4.0000 mL | INTRAMUSCULAR | Status: AC | PRN
  Administered 2024-04-25: 4 mL

## 2024-04-25 NOTE — Progress Notes (Signed)
 Chief Complaint: Right knee pain     History of Present Illness:    Cheyenne Ramirez is a 58 y.o. female presents to clinic today for evaluation of pain in her right knee.  She had been experiencing some increased discomfort in the knee over the last 2 to 3 weeks, which became significantly worsened after doing some house and yard work yesterday.  Denies any known injury mechanism.  She does report some swelling around the knee and states that pain is mainly medial.  She has tried rest, ice, Tylenol , tramadol , and a compression sleeve thus far.  She does recall having this knee aspirated and injected about 5 years ago.  She is leaving on a vacation tomorrow to [place] .   Surgical History:   None  PMH/PSH/Family History/Social History/Meds/Allergies:    Past Medical History:  Diagnosis Date   Anxiety    Arthritis    Chronic anal fissure    Depression    History of kidney stones    PONV (postoperative nausea and vomiting)    Rectal pain    Seasonal and perennial allergic rhinitis    Wears glasses    Past Surgical History:  Procedure Laterality Date   APPENDECTOMY  age 5   LAMINECTOMY AND MICRODISCECTOMY LUMBAR SPINE  10-09-2005     [facility]   SPHINCTEROTOMY N/A 04/08/2018   Procedure: CHEMICAL SPHINCTEROTOMY BOTOX ;  Surgeon: Joyce Nixon, MD;  Location: [facility];  Service: General;  Laterality: N/A;   VAGINAL HYSTERECTOMY  1993   Social History   Socioeconomic History   Marital status: Married    Spouse name: Not on file   Number of children: Not on file   Years of education: Not on file   Highest education level: Not on file  Occupational History   Not on file  Tobacco Use   Smoking status: Never   Smokeless tobacco: Never  Vaping Use   Vaping status: Never Used  Substance and Sexual Activity   Alcohol use: Never   Drug use: Never   Sexual activity: Not on file  Other Topics Concern   Not on file  Social  History Narrative   Not on file   Social Drivers of Health   Financial Resource Strain: Not on file  Food Insecurity: Not on file  Transportation Needs: Not on file  Physical Activity: Not on file  Stress: Not on file  Social Connections: Not on file   History reviewed. No pertinent family history. Allergies  Allergen Reactions   Levaquin [Levofloxacin] Nausea And Vomiting   Sulfa Antibiotics     UNKNOWN REACTION   Codeine Itching   Prednisone Rash   Current Outpatient Medications  Medication Sig Dispense Refill   ALPRAZolam (XANAX) 0.5 MG tablet Take 0.5 mg by mouth 3 (three) times daily.     azelastine (ASTELIN) 0.1 % nasal spray Place 2 sprays into both nostrils every morning. Use in each nostril as directed     b complex vitamins tablet Take 1 tablet by mouth daily.     citalopram (CELEXA) 40 MG tablet Take 40 mg by mouth every morning.     cyclobenzaprine (FLEXERIL) 10 MG tablet Take 10 mg by mouth 3 (three) times daily as needed for muscle spasms.     dicyclomine (BENTYL) 10 MG capsule  Take 10 mg by mouth 3 (three) times daily as needed for spasms.     diphenhydrAMINE (BENADRYL) 25 MG tablet Take 25 mg by mouth at bedtime.     diphenhydramine-acetaminophen  (TYLENOL  PM) 25-500 MG TABS tablet Take 1 tablet by mouth at bedtime.     Flaxseed, Linseed, (FLAX SEED OIL PO) Take by mouth daily.      fluticasone (FLONASE) 50 MCG/ACT nasal spray Place 2 sprays into both nostrils at bedtime.     loratadine-pseudoephedrine (CLARITIN-D 12 HOUR) 5-120 MG tablet Take 1 tablet by mouth 2 (two) times daily.     naproxen sodium (ALEVE) 220 MG tablet Take 220 mg by mouth daily as needed.     Probiotic Product (ULTRAFLORA IMMUNE HEALTH PO) Take by mouth daily.      Current Facility-Administered Medications  Medication Dose Route Frequency Provider Last Rate Last Admin   methylPREDNISolone  acetate (DEPO-MEDROL ) injection 20 mg  20 mg Intra-articular Once Hilts, Michael, MD       No results  found.  Review of Systems:   A ROS was performed including pertinent positives and negatives as documented in the HPI.  Physical Exam :   Constitutional: NAD and appears stated age Neurological: Alert and oriented Psych: Appropriate affect and cooperative There were no vitals taken for this visit.   Comprehensive Musculoskeletal Exam:    Right knee exam demonstrates active range of motion from 0 to 120 degrees with mild crepitus palpable.  Tenderness over the medial joint line and pes anserine bursa.  Collateral ligaments are stable with varus and valgus stress.  Knee flexion and extension strength is 5/5.  No overlying erythema or warmth and negative for significant effusion.  Imaging:   Xray (right knee 4 views): Mild to moderate joint space narrowing in the medial compartment with sclerosis of the medial tibial plateau.  No evidence of acute bony abnormality.  Small patellofemoral osteophytes noted.   I personally reviewed and interpreted the radiographs.   Assessment:   58 y.o. female with acute medial base pain of the right knee.  This does not appear to have been brought on by an acute injury, but likely more of an overuse mechanism given her activity yesterday performing work around the house.  Pain is mainly medial, and x-rays today do demonstrate joint space narrowing within the medial tibiofemoral compartment.  She does also have some tenderness over the pes anserine bursa, so also considering involvement of bursitis.  She has done well with a cortisone injection in the past, so I have offered to repeat this today for symptomatic relief.  Injection was performed in clinic today after patient provided consent and she tolerated this well.  Recommend trialing topical Voltaren for potential underlying bursitis.  Will plan to assess relief with this and follow-up as needed.  Plan :    - Right knee cortisone injection performed today after patient consent - Return to clinic as  needed     Procedure Note  Patient: Cheyenne Ramirez             Date of Birth: 08-20-1966           MRN: [id number]             Visit Date: 04/25/2024  Procedures: Visit Diagnoses:  1. Acute pain of right knee     Large Joint Inj: R knee on 04/25/2024 12:31 PM Indications: pain Details: 22 G 1.5 in needle, anterolateral approach Medications: 4 mL lidocaine  1 %; 2 mL triamcinolone   acetonide 40 MG/ML Outcome: tolerated well, no immediate complications Procedure, treatment alternatives, risks and benefits explained, specific risks discussed. Consent was given by the patient. Immediately prior to procedure a time out was called to verify the correct patient, procedure, equipment, support staff and site/side marked as required. Patient was prepped and draped in the usual sterile fashion.       I personally saw and evaluated the patient, and participated in the management and treatment plan.  Sharrell Deck, PA-C Orthopedics

## 2024-05-22 DIAGNOSIS — R300 Dysuria: Secondary | ICD-10-CM | POA: Diagnosis not present

## 2024-05-22 DIAGNOSIS — R350 Frequency of micturition: Secondary | ICD-10-CM | POA: Diagnosis not present

## 2024-07-01 DIAGNOSIS — Z1231 Encounter for screening mammogram for malignant neoplasm of breast: Secondary | ICD-10-CM | POA: Diagnosis not present

## 2024-07-22 DIAGNOSIS — K580 Irritable bowel syndrome with diarrhea: Secondary | ICD-10-CM | POA: Diagnosis not present

## 2024-07-22 DIAGNOSIS — J309 Allergic rhinitis, unspecified: Secondary | ICD-10-CM | POA: Diagnosis not present

## 2024-07-22 DIAGNOSIS — M5030 Other cervical disc degeneration, unspecified cervical region: Secondary | ICD-10-CM | POA: Diagnosis not present

## 2024-07-22 DIAGNOSIS — F419 Anxiety disorder, unspecified: Secondary | ICD-10-CM | POA: Diagnosis not present

## 2024-07-26 DIAGNOSIS — E7800 Pure hypercholesterolemia, unspecified: Secondary | ICD-10-CM | POA: Diagnosis not present

## 2024-10-10 ENCOUNTER — Encounter: Payer: Self-pay | Admitting: Radiology
